# Patient Record
Sex: Female | Born: 1990
Health system: Southern US, Community
[De-identification: ages and names within clinical notes are randomized; demographics above are authoritative.]

## PROBLEM LIST (undated history)

## (undated) ENCOUNTER — Inpatient Hospital Stay (HOSPITAL_COMMUNITY): Payer: Self-pay

## (undated) DIAGNOSIS — D649 Anemia, unspecified: Secondary | ICD-10-CM

## (undated) DIAGNOSIS — M419 Scoliosis, unspecified: Secondary | ICD-10-CM

## (undated) DIAGNOSIS — F329 Major depressive disorder, single episode, unspecified: Secondary | ICD-10-CM

## (undated) DIAGNOSIS — J45909 Unspecified asthma, uncomplicated: Secondary | ICD-10-CM

## (undated) DIAGNOSIS — R51 Headache: Secondary | ICD-10-CM

## (undated) DIAGNOSIS — R519 Headache, unspecified: Secondary | ICD-10-CM

## (undated) DIAGNOSIS — F1910 Other psychoactive substance abuse, uncomplicated: Secondary | ICD-10-CM

## (undated) DIAGNOSIS — R569 Unspecified convulsions: Secondary | ICD-10-CM

## (undated) DIAGNOSIS — F419 Anxiety disorder, unspecified: Secondary | ICD-10-CM

## (undated) DIAGNOSIS — F32A Depression, unspecified: Secondary | ICD-10-CM

## (undated) DIAGNOSIS — K3184 Gastroparesis: Secondary | ICD-10-CM

## (undated) DIAGNOSIS — T7840XA Allergy, unspecified, initial encounter: Secondary | ICD-10-CM

## (undated) HISTORY — DX: Anemia, unspecified: D64.9

## (undated) HISTORY — PX: NO PAST SURGERIES: SHX2092

## (undated) HISTORY — DX: Anxiety disorder, unspecified: F41.9

## (undated) HISTORY — DX: Allergy, unspecified, initial encounter: T78.40XA

## (undated) HISTORY — PX: UPPER GI ENDOSCOPY: SHX6162

## (undated) HISTORY — DX: Depression, unspecified: F32.A

## (undated) HISTORY — DX: Major depressive disorder, single episode, unspecified: F32.9

## (undated) HISTORY — DX: Other psychoactive substance abuse, uncomplicated: F19.10

---

## 2012-03-18 ENCOUNTER — Encounter (HOSPITAL_BASED_OUTPATIENT_CLINIC_OR_DEPARTMENT_OTHER): Payer: Self-pay | Admitting: *Deleted

## 2012-03-18 ENCOUNTER — Emergency Department (HOSPITAL_BASED_OUTPATIENT_CLINIC_OR_DEPARTMENT_OTHER): Payer: TRICARE For Life (TFL)

## 2012-03-18 ENCOUNTER — Emergency Department (HOSPITAL_BASED_OUTPATIENT_CLINIC_OR_DEPARTMENT_OTHER)
Admission: EM | Admit: 2012-03-18 | Discharge: 2012-03-18 | Disposition: A | Discharge status: Home / Self Care | Payer: TRICARE For Life (TFL) | Attending: Emergency Medicine | Admitting: Emergency Medicine

## 2012-03-18 DIAGNOSIS — J3489 Other specified disorders of nose and nasal sinuses: Secondary | ICD-10-CM | POA: Insufficient documentation

## 2012-03-18 DIAGNOSIS — J069 Acute upper respiratory infection, unspecified: Secondary | ICD-10-CM | POA: Insufficient documentation

## 2012-03-18 DIAGNOSIS — F17200 Nicotine dependence, unspecified, uncomplicated: Secondary | ICD-10-CM | POA: Insufficient documentation

## 2012-03-18 DIAGNOSIS — R079 Chest pain, unspecified: Secondary | ICD-10-CM | POA: Insufficient documentation

## 2012-03-18 DIAGNOSIS — J45909 Unspecified asthma, uncomplicated: Secondary | ICD-10-CM | POA: Insufficient documentation

## 2012-03-18 HISTORY — DX: Unspecified asthma, uncomplicated: J45.909

## 2012-03-18 LAB — URINALYSIS, ROUTINE W REFLEX MICROSCOPIC
Glucose, UA: NEGATIVE mg/dL
Hgb urine dipstick: NEGATIVE
Ketones, ur: 15 mg/dL — AB
Protein, ur: NEGATIVE mg/dL
Urobilinogen, UA: 1 mg/dL (ref 0.0–1.0)

## 2012-03-18 MED ORDER — ONDANSETRON 4 MG PO TBDP
4.0000 mg | ORAL_TABLET | Freq: Once | ORAL | Status: AC
  Administered 2012-03-18: 4 mg via ORAL
  Filled 2012-03-18: qty 1

## 2012-03-18 MED ORDER — IBUPROFEN 800 MG PO TABS: 800.0000 mg | ORAL_TABLET | Freq: Three times a day (TID) | ORAL | Status: DC

## 2012-03-18 MED ORDER — ACETAMINOPHEN 325 MG PO TABS
650.0000 mg | ORAL_TABLET | Freq: Once | ORAL | Status: AC
  Administered 2012-03-18: 650 mg via ORAL
  Filled 2012-03-18: qty 2

## 2012-03-18 NOTE — ED Notes (Signed)
Sore throat, chills, aching all over, and cough. Symptoms x 2 days.

## 2012-03-18 NOTE — ED Provider Notes (Signed)
History     CSN: [id number]  Arrival date & time 03/18/12  1842   First MD Initiated Contact with Patient 03/18/12 2011      Chief Complaint  Patient presents with  . URI    (Consider location/radiation/quality/duration/timing/severity/associated sxs/prior treatment) Patient is a 21 y.o. female presenting with cough. The history is provided by the patient. No language interpreter was used.  Cough This is a new problem. The current episode started more than 2 days ago. The problem occurs constantly. The problem has been gradually worsening. The cough is non-productive. There has been no fever. Associated symptoms include chest pain, ear congestion and rhinorrhea. Pertinent negatives include no shortness of breath. She has tried nothing for the symptoms. She is not a smoker. Her past medical history does not include pneumonia.  Pt complains of cough and congestion.   Pt reports she is aching all over  Past Medical History  Diagnosis Date  . Asthma     History reviewed. No pertinent past surgical history.  No family history on file.  History  Substance Use Topics  . Smoking status: Current Every Day Smoker -- 0.5 packs/day    Types: Cigarettes  . Smokeless tobacco: Not on file  . Alcohol Use: Yes    OB History    Grav Para Term Preterm Abortions TAB SAB Ect Mult Living                  Review of Systems  HENT: Positive for rhinorrhea.   Respiratory: Positive for cough. Negative for shortness of breath.   Cardiovascular: Positive for chest pain.  All other systems reviewed and are negative.    Allergies  Review of patient's allergies indicates no known allergies.  Home Medications  No current outpatient prescriptions on file.  BP 119/81  Pulse 108  Temp 98 F (36.7 C) (Oral)  Resp 20  SpO2 100%  Physical Exam  Nursing note and vitals reviewed. Constitutional: She appears well-developed and well-nourished.  HENT:  Head: Normocephalic and atraumatic.    Right Ear: External ear normal.  Left Ear: External ear normal.  Nose: Nose normal.  Mouth/Throat: Oropharynx is clear and moist.  Eyes: Conjunctivae normal and EOM are normal. Pupils are equal, round, and reactive to light.  Neck: Normal range of motion. Neck supple.  Cardiovascular: Normal rate and normal heart sounds.   Pulmonary/Chest: Effort normal.  Abdominal: Soft.  Musculoskeletal: Normal range of motion.  Neurological: She is alert.  Skin: Skin is warm.  Psychiatric: She has a normal mood and affect.    ED Course  Procedures (including critical care time)  Labs Reviewed - No data to display No results found.   No diagnosis found.    MDM  No pneumonia        Lonia Sk[place], [place] 03/18/12 2257

## 2012-03-19 NOTE — ED Provider Notes (Signed)
Medical screening examination/treatment/procedure(s) were performed by non-physician practitioner and as supervising physician I was immediately available for consultation/collaboration.   Carleene Cooper III, MD 03/19/12 [phone]

## 2012-05-31 ENCOUNTER — Emergency Department (HOSPITAL_BASED_OUTPATIENT_CLINIC_OR_DEPARTMENT_OTHER): Payer: Self-pay

## 2012-05-31 ENCOUNTER — Emergency Department (HOSPITAL_BASED_OUTPATIENT_CLINIC_OR_DEPARTMENT_OTHER)
Admission: EM | Admit: 2012-05-31 | Discharge: 2012-05-31 | Disposition: A | Discharge status: Home / Self Care | Payer: Self-pay | Attending: Emergency Medicine | Admitting: Emergency Medicine

## 2012-05-31 ENCOUNTER — Encounter (HOSPITAL_BASED_OUTPATIENT_CLINIC_OR_DEPARTMENT_OTHER): Payer: Self-pay | Admitting: Family Medicine

## 2012-05-31 DIAGNOSIS — M62838 Other muscle spasm: Secondary | ICD-10-CM | POA: Insufficient documentation

## 2012-05-31 DIAGNOSIS — F17200 Nicotine dependence, unspecified, uncomplicated: Secondary | ICD-10-CM | POA: Insufficient documentation

## 2012-05-31 DIAGNOSIS — J45909 Unspecified asthma, uncomplicated: Secondary | ICD-10-CM | POA: Insufficient documentation

## 2012-05-31 DIAGNOSIS — M898X1 Other specified disorders of bone, shoulder: Secondary | ICD-10-CM

## 2012-05-31 DIAGNOSIS — Z8719 Personal history of other diseases of the digestive system: Secondary | ICD-10-CM | POA: Insufficient documentation

## 2012-05-31 HISTORY — DX: Gastroparesis: K31.84

## 2012-05-31 MED ORDER — CYCLOBENZAPRINE HCL 10 MG PO TABS: 10.0000 mg | ORAL_TABLET | Freq: Three times a day (TID) | ORAL | Status: DC | PRN

## 2012-05-31 MED ORDER — OXYCODONE-ACETAMINOPHEN 5-325 MG PO TABS
1.0000 | ORAL_TABLET | Freq: Once | ORAL | Status: AC
  Administered 2012-05-31: 1 via ORAL
  Filled 2012-05-31 (×2): qty 1

## 2012-05-31 MED ORDER — OXYCODONE-ACETAMINOPHEN 5-325 MG PO TABS: 2.0000 | ORAL_TABLET | ORAL | Status: DC | PRN

## 2012-05-31 NOTE — ED Notes (Signed)
Pt c/o left shoulder blade, collar pain x 8 days. Pt denies injury.

## 2012-05-31 NOTE — ED Provider Notes (Addendum)
History     CSN: [id number]  Arrival date & time 05/31/12  7829   First MD Initiated Contact with Patient 05/31/12 1011      Chief Complaint  Patient presents with  . Shoulder Pain    (Consider location/radiation/quality/duration/timing/severity/associated sxs/prior treatment) Patient is a 22 y.o. female presenting with shoulder pain. The history is provided by the patient.  Shoulder Pain This is a new (She is unaware of any injury but states that it occurred on new years after she drank a decent amount of alcohol and went to bed and in the morning when she woke up it was there.) problem. Episode onset: 8 days ago.  The problem occurs constantly. The problem has been gradually improving. Pertinent negatives include no shortness of breath. Associated symptoms comments: Pain and swelling over the distal clavicle. Pain in the left side of neck and back. The symptoms are aggravated by bending and twisting. The symptoms are relieved by relaxation. She has tried acetaminophen for the symptoms. The treatment provided no relief.    Past Medical History  Diagnosis Date  . Asthma   . Gastroparesis     History reviewed. No pertinent past surgical history.  No family history on file.  History  Substance Use Topics  . Smoking status: Current Every Day Smoker -- 0.5 packs/day    Types: Cigarettes  . Smokeless tobacco: Not on file  . Alcohol Use: Yes    OB History    Grav Para Term Preterm Abortions TAB SAB Ect Mult Living                  Review of Systems  Respiratory: Negative for cough and shortness of breath.   All other systems reviewed and are negative.    Allergies  Hydrocodone  Home Medications   Current Outpatient Rx  Name  Route  Sig  Dispense  Refill  . IBUPROFEN 800 MG PO TABS   Oral   Take 1 tablet (800 mg total) by mouth 3 (three) times daily.   21 tablet   0     BP 109/73  Pulse 73  Temp 98.1 F (36.7 C) (Oral)  Resp 20  Ht 5\' 4"  (1.626 m)  Wt  112 lb (50.803 kg)  BMI 19.22 kg/m2  SpO2 100%  LMP 05/04/2012  Physical Exam  Nursing note and vitals reviewed. Constitutional: She is oriented to person, place, and time. She appears well-developed and well-nourished. No distress.  HENT:  Head: Normocephalic and atraumatic.  Mouth/Throat: Oropharynx is clear and moist.  Eyes: Conjunctivae normal and EOM are normal. Pupils are equal, round, and reactive to light.  Neck: Normal range of motion. Neck supple.  Cardiovascular: Normal rate, regular rhythm and intact distal pulses.   No murmur heard. Pulmonary/Chest: Effort normal and breath sounds normal. No respiratory distress. She has no wheezes. She has no rales.  Musculoskeletal: Normal range of motion. She exhibits no edema and no tenderness.       Left shoulder: Normal.       Thoracic back: She exhibits tenderness, pain and spasm. She exhibits no bony tenderness and normal pulse.       Back:       Arms: Neurological: She is alert and oriented to person, place, and time.  Skin: Skin is warm and dry. No rash noted. No erythema.  Psychiatric: She has a normal mood and affect. Her behavior is normal.    ED Course  Procedures (including critical care time)  Labs  Reviewed - No data to display Dg Clavicle Left  05/31/2012  *RADIOLOGY REPORT*  Clinical Data: Left shoulder pain  LEFT CLAVICLE - 2+ VIEWS  Comparison: None.  Findings: Two views of the left clavicle submitted.  No acute fracture or subluxation.  No radiopaque foreign body.  IMPRESSION:  No acute fracture or subluxation.   Original Report Authenticated By: Natasha Mead, M.D.      1. Clavicle pain   2. Muscle spasm       MDM   Pain and swelling in the distal clavicle for the last 8 days with also pain and spasm in the left trapezius. Patient does have noticeable swelling and tenderness over her distal clavicle. She is neurovascularly intact and has full range of motion of her shoulder. She is unaware of any  injury. Plain films are within normal limits. Will discharge patient with anti-inflammatory and muscle relaxation. She will followup if symptoms worsen.     Gwyneth Sprout, MD 05/31/12 1149  Gwyneth Sprout, MD 05/31/12 1151  Gwyneth Sprout, MD 05/31/12 1204

## 2014-03-18 ENCOUNTER — Emergency Department (HOSPITAL_COMMUNITY): Payer: TRICARE For Life (TFL)

## 2014-03-18 ENCOUNTER — Emergency Department (HOSPITAL_COMMUNITY)
Admission: EM | Admit: 2014-03-18 | Discharge: 2014-03-18 | Disposition: A | Discharge status: Home / Self Care | Payer: TRICARE For Life (TFL) | Attending: Emergency Medicine | Admitting: Emergency Medicine

## 2014-03-18 ENCOUNTER — Encounter (HOSPITAL_COMMUNITY): Payer: Self-pay | Admitting: Emergency Medicine

## 2014-03-18 DIAGNOSIS — Y9389 Activity, other specified: Secondary | ICD-10-CM | POA: Insufficient documentation

## 2014-03-18 DIAGNOSIS — M545 Low back pain, unspecified: Secondary | ICD-10-CM

## 2014-03-18 DIAGNOSIS — Z720 Tobacco use: Secondary | ICD-10-CM | POA: Insufficient documentation

## 2014-03-18 DIAGNOSIS — Z8719 Personal history of other diseases of the digestive system: Secondary | ICD-10-CM | POA: Insufficient documentation

## 2014-03-18 DIAGNOSIS — J45909 Unspecified asthma, uncomplicated: Secondary | ICD-10-CM | POA: Insufficient documentation

## 2014-03-18 DIAGNOSIS — Y92410 Unspecified street and highway as the place of occurrence of the external cause: Secondary | ICD-10-CM | POA: Insufficient documentation

## 2014-03-18 DIAGNOSIS — M542 Cervicalgia: Secondary | ICD-10-CM

## 2014-03-18 DIAGNOSIS — S161XXA Strain of muscle, fascia and tendon at neck level, initial encounter: Secondary | ICD-10-CM | POA: Insufficient documentation

## 2014-03-18 DIAGNOSIS — S39012A Strain of muscle, fascia and tendon of lower back, initial encounter: Secondary | ICD-10-CM | POA: Insufficient documentation

## 2014-03-18 MED ORDER — DIAZEPAM 5 MG PO TABS
5.0000 mg | ORAL_TABLET | Freq: Once | ORAL | Status: AC
  Administered 2014-03-18: 5 mg via ORAL
  Filled 2014-03-18: qty 1

## 2014-03-18 MED ORDER — OXYCODONE-ACETAMINOPHEN 5-325 MG PO TABS: 1.0000 | ORAL_TABLET | ORAL | Status: DC | PRN

## 2014-03-18 MED ORDER — IBUPROFEN 800 MG PO TABS
800.0000 mg | ORAL_TABLET | Freq: Once | ORAL | Status: AC
  Administered 2014-03-18: 800 mg via ORAL
  Filled 2014-03-18: qty 1

## 2014-03-18 MED ORDER — METHOCARBAMOL 750 MG PO TABS: 750.0000 mg | ORAL_TABLET | Freq: Four times a day (QID) | ORAL | Status: DC | PRN

## 2014-03-18 MED ORDER — NAPROXEN 500 MG PO TABS: 500.0000 mg | ORAL_TABLET | Freq: Two times a day (BID) | ORAL | Status: DC | PRN
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
## 2014-03-18 NOTE — ED Provider Notes (Signed)
Medical screening examination/treatment/procedure(s) were performed by non-physician practitioner and as supervising physician I was immediately available for consultation/collaboration.   Yameli Delamater, MD 03/18/14 2225 

## 2014-03-18 NOTE — Discharge Instructions (Signed)
1. Medications: robaxin, naproxyn, vicodin, usual home medications 2. Treatment: rest, drink plenty of fluids, gentle stretching as discussed, alternate ice and heat 3. Follow Up: Please followup with your primary doctor in 3 days for discussion of your diagnoses and further evaluation after today's visit; if you do not have a primary care doctor use the resource guide provided to find one;  Return to the ER for worsening back pain, difficulty walking, loss of bowel or bladder control or other concerning symptoms     Lumbosacral Strain Lumbosacral strain is a strain of any of the parts that make up your lumbosacral vertebrae. Your lumbosacral vertebrae are the bones that make up the lower third of your backbone. Your lumbosacral vertebrae are held together by muscles and tough, fibrous tissue (ligaments).  CAUSES  A sudden blow to your back can cause lumbosacral strain. Also, anything that causes an excessive stretch of the muscles in the low back can cause this strain. This is typically seen when people exert themselves strenuously, fall, lift heavy objects, bend, or crouch repeatedly. RISK FACTORS  Physically demanding work.  Participation in pushing or pulling sports or sports that require a sudden twist of the back (tennis, golf, baseball).  Weight lifting.  Excessive lower back curvature.  Forward-tilted pelvis.  Weak back or abdominal muscles or both.  Tight hamstrings. SIGNS AND SYMPTOMS  Lumbosacral strain may cause pain in the area of your injury or pain that moves (radiates) down your leg.  DIAGNOSIS Your health care provider can often diagnose lumbosacral strain through a physical exam. In some cases, you may need tests such as X-ray exams.  TREATMENT  Treatment for your lower back injury depends on many factors that your clinician will have to evaluate. However, most treatment will include the use of anti-inflammatory medicines. HOME CARE INSTRUCTIONS   Avoid hard  physical activities (tennis, racquetball, waterskiing) if you are not in proper physical condition for it. This may aggravate or create problems.  If you have a back problem, avoid sports requiring sudden body movements. Swimming and walking are generally safer activities.  Maintain good posture.  Maintain a healthy weight.  For acute conditions, you may put ice on the injured area.  Put ice in a plastic bag.  Place a towel between your skin and the bag.  Leave the ice on for 20 minutes, 2-3 times a day.  When the low back starts healing, stretching and strengthening exercises may be recommended. SEEK MEDICAL CARE IF:  Your back pain is getting worse.  You experience severe back pain not relieved with medicines. SEEK IMMEDIATE MEDICAL CARE IF:   You have numbness, tingling, weakness, or problems with the use of your arms or legs.  There is a change in bowel or bladder control.  You have increasing pain in any area of the body, including your belly (abdomen).  You notice shortness of breath, dizziness, or feel faint.  You feel sick to your stomach (nauseous), are throwing up (vomiting), or become sweaty.  You notice discoloration of your toes or legs, or your feet get very cold. MAKE SURE YOU:   Understand these instructions.  Will watch your condition.  Will get help right away if you are not doing well or get worse. Document Released: 02/15/2005 Document Revised: 05/13/2013 Document Reviewed: 12/25/2012 [facility] Patient Information 2015 [place]. This information is not intended to replace advice given to you by your health care provider. Make sure you discuss any questions you have with your health  care provider.    Emergency Department Resource Guide 1) Find a Doctor and Pay Out of Pocket Although you won't have to find out who is covered by your insurance plan, it is a good idea to ask around and get recommendations. You will then need to call the office and  see if the doctor you have chosen will accept you as a new patient and what types of options they offer for patients who are self-pay. Some doctors offer discounts or will set up payment plans for their patients who do not have insurance, but you will need to ask so you aren't surprised when you get to your appointment.  2) Contact Your Local Health Department Not all health departments have doctors that can see patients for sick visits, but many do, so it is worth a call to see if yours does. If you don't know where your local health department is, you can check in your phone book. The CDC also has a tool to help you locate your state's health department, and many state websites also have listings of all of their local health departments.  3) Find a Walk-in Clinic If your illness is not likely to be very severe or complicated, you may want to try a walk in clinic. These are popping up all over the country in pharmacies, drugstores, and shopping centers. They're usually staffed by nurse practitioners or physician assistants that have been trained to treat common illnesses and complaints. They're usually fairly quick and inexpensive. However, if you have serious medical issues or chronic medical problems, these are probably not your best option.  No Primary Care Doctor: - Call Health Connect at  [phone] - they can help you locate a primary care doctor that  accepts your insurance, provides certain services, etc. - Physician Referral Service- [phone]  Chronic Pain Problems: Organization         Address  Phone   Notes  [facility]  [phone] Patients need to be referred by their primary care doctor.   Medication Assistance: Organization         Address  Phone   Notes  [facility] [address] [place], [place] [address] [phone] --Must be a resident of [facility] -- Must have NO insurance coverage whatsoever  (no Medicaid/ Medicare, etc.) -- The pt. MUST have a primary care doctor that directs their care regularly and follows them in the community   [facility]  [phone]   [employer]  [phone]    Agencies that provide inexpensive medical care: Organization         Address  Phone   Notes  [facility]  [phone]   [facility]    [phone]   [facility] [address] [place], [place] [address] [phone]   [facility] [address] [place] [place] [phone]   [facility]anned Parenthood    [phone]   [facility]    [phone]   [facility]  [address], [place] Phone:  [phone], Fax:  [phone] Hours of Operation:  9 am - 6 pm, M-F.  Also accepts Medicaid/Medicare and self-pay.  [facility]  [address], [place] Phone: [phone], Fax: [phone]. Hours of Operation:  8:30 am - 5:30 pm, M-F.  Also accepts Medicaid and  self-pay.  [facility] [place] [address], [place] Phone: [phone]   [facility] [address] Natasha Bence [place], [place] [phone], Ext. [phone] Mondays & Thursdays: 7-9 AM.  First 15 patients are seen on a first come, first serve basis.    Medicaid-accepting [facility] Providers:  Organization         Address  Phone   Notes  [facility] [address], [place] [phone] Also accepts self-pay patients.  [facility] [address] [place], [place]  [phone]   [facility] [address], [place] [phone]   [facility] [address], [place] [phone]   Renaye Rakers [address], [place]   [phone] Only accepts [place] Access [place] patients after they have their name applied to their  card.   Self-Pay (no insurance) in [facility]:  Organization         Address  Phone   Notes  Sickle Cell Patients, [facility] [address] [place], [place] [phone]   [facility] [address] [place], [place] [phone]   [facility]  [address], [place] [phone]   [facility]/Dr. Osei-Bonsu  [address] [place] or [address], [place] [phone] Phone number for both [place] and [place] locations is the same.  [facility] [address], [place] [phone]   [facility] [address], [place] or [address] [phone]   [facility] [address] [place] [phone], phone; [phone], fax Sees patients 1st and 3rd Saturday of every month.  Must not qualify for public or private insurance (i.e. Medicaid, Medicare, [place] Health Choice, Veterans' Benefits)  Household income should be no more than 200% of the poverty level The clinic cannot treat you if you are pregnant or think you are pregnant  Sexually transmitted diseases are not treated at the clinic.   Dental Care: Organization         Address  Phone  Notes  [facility] [address] [place], [place] [phone] Accepts children up to age 69 who are enrolled in [place] or [place] Health Choice; pregnant women with a Medicaid card; and children who have applied for Medicaid or [place] Health Choice, but were declined, whose parents can pay a reduced fee at time of service.  [facility]  [address], [place] [phone] Accepts children up to age 94 who are enrolled in [place] or [place] Health Choice; pregnant women with a Medicaid card; and children who have applied for Medicaid or [place] Health  Choice, but were declined, whose parents can pay a reduced fee at time of service.  [facility]  [address] [place], [place] [phone] Patients are seen by appointment only. Walk-ins are not accepted. [facility] will see patients 51 years of age and older. Monday - Tuesday (8am-5pm) Most Wednesdays (8:30-5pm) $30 per visit, cash only  [facility]  [address], [facility] [phone] Patients are seen by appointment only. Walk-ins are not accepted. [facility] will see patients 18 years  of age and older. One Wednesday Evening (Monthly: Volunteer Based).  $30 per visit, cash only  [facility]  [phone] for adults; Children under age 204, call Graduate Pediatric Dentistry at [phone]. Children aged 684-14, please call [phone] to request a pediatric application.  Dental services are provided in all areas of dental care including fillings, crowns and bridges, complete and partial dentures, implants, gum treatment, root canals, and extractions. Preventive care is also provided. Treatment is provided to both adults and children. Patients are selected via a lottery and there is often a waiting list.   [facility] [address], [place]  [phone] [email or web address]   [facility] [address], [place], [place] [phone], Ext. [phone] Second and Fourth Thursday of each month, opens at 6:30 AM; Clinic ends at 9 AM.  Patients are seen on a first-come first-served basis, and a limited number are seen during each clinic.   [facility]  [address], [place], [place] [phone]) [phone]   Eligibility Requirements You must have lived in [place], [place], or [place] counties for at least the last three months.   You cannot be eligible for state or federal sponsored [place] Cityhealthcare insurance, including CIGNAVeterans Administration, [place]icaid, or [employer] EntertainmentMedicare.   You  generally cannot be eligible for healthcare insurance through your employer.    How to apply: Eligibility screenings are held every Tuesday and Wednesday afternoon from 1:00 pm until 4:00 pm. You do not need an appointment for the interview!  [facility] [address], [place], [place] [phone]   [facility]  [phone]   [facility]  [phone]   [facility]  [phone]    Behavioral Health Resources in the Community: Intensive Outpatient Programs Organization         Address  Phone  Notes  [facility] [address], [place], [place] [phone]   [facility] [address], [place], [place] [phone]   [facility] [address], [place], [place]  [phone]   [facility] [address],  [place], [place] [phone] or [phone]   Substance Abuse Resources Organization         Address  Phone  Notes  [facility]  [phone]   [facility]  [phone]   [facility]  [phone]   [facility]  [phone]   Residential & Outpatient Substance Abuse Program  [phone]   Psychological Services Organization         Address  Phone  Notes  [facility]  [phone]   [facility]  [phone]- [phone]   [facility] [address], [place] [phone] or [phone]    Mobile Crisis Teams Organization         Address  Phone  Notes  [facility], Mobile Crisis Care Unit  [phone]   [facility]  [address] [place], [place] [phone]   Doristine LocksSharon DeEsch [address] [place] [phone]    Self-Help/Support Groups Organization         Address  Phone             Notes  Mental Health Assoc. of  - variety of support groups  336- [phone] Call for  more information  Narcotics Anonymous (NA), [facility] [address], [place]  2 meetings at this location   Residential Treatment Programs Organization         Address  Phone  Notes  [facility] [address],    [place]  [phone]   [facility] [place]  [address], [place] [address], [place], [place] [phone]   [facility] [address] [place], [place] [phone] Admissions: 8am-3pm M-F  [facility] [address]    [place], [place] [phone]   [facility] [address] [place], [place], [place] [phone]   [facility] [address]  [place], [place] [phone]   [facility] - Intensive Outpatient [address], [place], [place] [phone]   [facility] ([facility] [address]  [place], [place] [phone] or [phone]   [facility] ([facility]) [address], [place], [place] [phone] Accepts Medicaid  [facility] [address]  [place] [phone] Substance Abuse/Addiction Treatment   [facility] Organization         Address  Phone  Notes  [facility]  [phone]   Angie Fava, PhD [address] Ervin Knack [place], [place]   [phone] or [phone]   [facility]   [address] [place], [place] [phone]   [facility] [address], [place], [place] [phone] Insurance/Medicaid/sponsorship through [facility] and Families [address]                                    [place], [place] [phone] Therapy/tele-psych/case  [facility] [address], [place] [phone]    Dr. Lolly Mustache  [phone]   [facility]  [facility] Dept. 1) [address], [place] 2) [address] [place] 3)  [address], [place] [phone]  [phone]   [facility] Child Abuse Hotline [phone] or [phone] (After Hours)

## 2014-07-21 ENCOUNTER — Encounter (HOSPITAL_COMMUNITY): Payer: Self-pay | Admitting: Emergency Medicine

## 2014-07-21 ENCOUNTER — Emergency Department (HOSPITAL_COMMUNITY)
Admission: EM | Admit: 2014-07-21 | Discharge: 2014-07-21 | Disposition: A | Discharge status: Home / Self Care | Payer: No Typology Code available for payment source | Attending: Emergency Medicine | Admitting: Emergency Medicine

## 2014-07-21 DIAGNOSIS — Z720 Tobacco use: Secondary | ICD-10-CM | POA: Diagnosis not present

## 2014-07-21 DIAGNOSIS — Z3202 Encounter for pregnancy test, result negative: Secondary | ICD-10-CM | POA: Diagnosis not present

## 2014-07-21 DIAGNOSIS — J45909 Unspecified asthma, uncomplicated: Secondary | ICD-10-CM | POA: Diagnosis not present

## 2014-07-21 DIAGNOSIS — R197 Diarrhea, unspecified: Secondary | ICD-10-CM | POA: Diagnosis not present

## 2014-07-21 DIAGNOSIS — R1013 Epigastric pain: Secondary | ICD-10-CM | POA: Insufficient documentation

## 2014-07-21 DIAGNOSIS — Z79899 Other long term (current) drug therapy: Secondary | ICD-10-CM | POA: Insufficient documentation

## 2014-07-21 DIAGNOSIS — Z8719 Personal history of other diseases of the digestive system: Secondary | ICD-10-CM | POA: Insufficient documentation

## 2014-07-21 DIAGNOSIS — R1010 Upper abdominal pain, unspecified: Secondary | ICD-10-CM | POA: Diagnosis not present

## 2014-07-21 LAB — URINALYSIS, ROUTINE W REFLEX MICROSCOPIC
BILIRUBIN URINE: NEGATIVE
Glucose, UA: NEGATIVE mg/dL
Hgb urine dipstick: NEGATIVE
KETONES UR: NEGATIVE mg/dL
Leukocytes, UA: NEGATIVE
NITRITE: NEGATIVE
Protein, ur: NEGATIVE mg/dL
Specific Gravity, Urine: 1.007 (ref 1.005–1.030)
UROBILINOGEN UA: 1 mg/dL (ref 0.0–1.0)
pH: 6 (ref 5.0–8.0)

## 2014-07-21 LAB — CBC WITH DIFFERENTIAL/PLATELET
Basophils Absolute: 0 10*3/uL (ref 0.0–0.1)
Basophils Relative: 0 % (ref 0–1)
EOS ABS: 0.1 10*3/uL (ref 0.0–0.7)
EOS PCT: 1 % (ref 0–5)
HCT: 40.6 % (ref 36.0–46.0)
HEMOGLOBIN: 13.3 g/dL (ref 12.0–15.0)
LYMPHS ABS: 1.9 10*3/uL (ref 0.7–4.0)
Lymphocytes Relative: 35 % (ref 12–46)
MCH: 28.2 pg (ref 26.0–34.0)
MCHC: 32.8 g/dL (ref 30.0–36.0)
MCV: 86 fL (ref 78.0–100.0)
MONO ABS: 0.5 10*3/uL (ref 0.1–1.0)
MONOS PCT: 10 % (ref 3–12)
Neutro Abs: 2.9 10*3/uL (ref 1.7–7.7)
Neutrophils Relative %: 54 % (ref 43–77)
Platelets: 271 10*3/uL (ref 150–400)
RBC: 4.72 MIL/uL (ref 3.87–5.11)
RDW: 13.8 % (ref 11.5–15.5)
WBC: 5.4 10*3/uL (ref 4.0–10.5)

## 2014-07-21 LAB — COMPREHENSIVE METABOLIC PANEL
ALK PHOS: 63 U/L (ref 39–117)
ALT: 13 U/L (ref 0–35)
AST: 18 U/L (ref 0–37)
Albumin: 4.3 g/dL (ref 3.5–5.2)
Anion gap: 6 (ref 5–15)
BUN: 11 mg/dL (ref 6–23)
CALCIUM: 9.1 mg/dL (ref 8.4–10.5)
CO2: 26 mmol/L (ref 19–32)
CREATININE: 0.61 mg/dL (ref 0.50–1.10)
Chloride: 106 mmol/L (ref 96–112)
GFR calc Af Amer: >90 mL/min (ref >90)
GFR calc non Af Amer: >90 mL/min (ref >90)
Glucose, Bld: 87 mg/dL (ref 70–99)
Potassium: 3.6 mmol/L (ref 3.5–5.1)
SODIUM: 138 mmol/L (ref 135–145)
TOTAL PROTEIN: 7.8 g/dL (ref 6.0–8.3)
Total Bilirubin: 1.1 mg/dL (ref 0.3–1.2)

## 2014-07-21 LAB — PREGNANCY, URINE: PREG TEST UR: NEGATIVE

## 2014-07-21 LAB — LIPASE, BLOOD: LIPASE: 18 U/L (ref 11–59)

## 2014-07-21 MED ORDER — HYOSCYAMINE SULFATE 0.125 MG PO TABS
0.1250 mg | ORAL_TABLET | Freq: Once | ORAL | Status: DC
Start: 2014-07-21 — End: 2014-07-21
  Filled 2014-07-21: qty 1

## 2014-07-21 MED ORDER — HYOSCYAMINE SULFATE 0.125 MG SL SUBL: 0.1250 mg | SUBLINGUAL_TABLET | SUBLINGUAL | Status: DC | PRN

## 2014-07-21 MED ORDER — HYOSCYAMINE SULFATE 0.125 MG SL SUBL
0.1250 mg | SUBLINGUAL_TABLET | Freq: Once | SUBLINGUAL | Status: AC
  Administered 2014-07-21: 0.125 mg via SUBLINGUAL

## 2014-07-21 NOTE — Discharge Instructions (Signed)

## 2014-07-21 NOTE — ED Notes (Signed)
Pt states that over the past couple weeks she has started eating more than once a day and ever since she has had diarrhea. Pt states that since 8-9am she has had about 6 episodes of diarrhea.

## 2014-07-21 NOTE — Progress Notes (Signed)
EDCM spoke to patient at bedside.  Patient listed as having [employer] insurance without a pcp.  Patient reports she needs to find a pcp and would go to the [address] [address]  [facility] also provided patient with list of pcps who accept [employer] insurance within a ten mile radius of patient's zip code [phone].  Dicussed importance and purpose of having a pcp.  Patient verbalized understanding and thankful for services.  No further EDCM needs at this time.

## 2014-07-21 NOTE — ED Provider Notes (Signed)
CSN: [id number]     Arrival date & time 07/21/14  1316 History   First MD Initiated Contact with Patient 07/21/14 1508     Chief Complaint  Patient presents with  . Diarrhea    Patient is a 24 y.o. female presenting with diarrhea. The history is provided by the patient. No language interpreter was used.  Diarrhea  Samantha Casey presents for evaluation of diarrhea. She reports 2 weeks of diarrhea after increasing her fluid intake. She has proximally 4 episodes a day of watery stools. There is no blood present. Today she's had 7 episodes of watery stools. She reports diffuse upper abdominal pain described as a pressure type sensation. Abdominal pain is waxing and waning with no clear triggers. She denies any fevers, nausea, vomiting, dysuria, discharge. Symptoms are moderate, waxing and waning, worsening. No recent antibiotic use. No sick contacts but she is around multiple children. She has not had any spring or stream water.  Past Medical History  Diagnosis Date  . Asthma   . Gastroparesis    History reviewed. No pertinent past surgical history. No family history on file. History  Substance Use Topics  . Smoking status: Current Every Day Smoker -- 0.50 packs/day    Types: Cigarettes  . Smokeless tobacco: Not on file  . Alcohol Use: Yes   OB History    No data available     Review of Systems  Gastrointestinal: Positive for diarrhea.  All other systems reviewed and are negative.     Allergies  Hydrocodone  Home Medications   Prior to Admission medications   Medication Sig Start Date End Date Taking? Authorizing Provider  methocarbamol (ROBAXIN) 750 MG tablet Take 1 tablet (750 mg total) by mouth 4 (four) times daily as needed for muscle spasms (Take 1 tablet every 6 hours as needed for muscle spasms.). 03/18/14   Hannah Muthersbaugh, PA-C  naproxen (NAPROSYN) 500 MG tablet Take 1 tablet (500 mg total) by mouth 2 (two) times daily as needed. 03/18/14   Hannah Muthersbaugh, PA-C   oxyCODONE-acetaminophen (PERCOCET) 5-325 MG per tablet Take 1-2 tablets by mouth every 4 (four) hours as needed (Take 1- 2 tablets every 4 - 6 hours as needed for pain.). 03/18/14   Hannah Muthersbaugh, PA-C   BP 122/72 mmHg  Pulse 69  Temp(Src) 98.3 F (36.8 C) (Oral)  Resp 18  SpO2 100%  LMP 06/24/2014 Physical Exam  Constitutional: She is oriented to person, place, and time. She appears well-developed and well-nourished.  HENT:  Head: Normocephalic and atraumatic.  Cardiovascular: Normal rate and regular rhythm.   No murmur heard. Pulmonary/Chest: Effort normal and breath sounds normal. No respiratory distress.  Abdominal: Soft. There is no rebound and no guarding.  Mild epigastric tenderness. Negative Murphy's.  Musculoskeletal: She exhibits no edema or tenderness.  Neurological: She is alert and oriented to person, place, and time.  Skin: Skin is warm and dry.  Psychiatric: She has a normal mood and affect. Her behavior is normal.  Nursing note and vitals reviewed.   ED Course  Procedures (including critical care time) Labs Review Labs Reviewed  COMPREHENSIVE METABOLIC PANEL  CBC WITH DIFFERENTIAL/PLATELET  URINALYSIS, ROUTINE W REFLEX MICROSCOPIC  LIPASE, BLOOD  PREGNANCY, URINE    Imaging Review No results found.   EKG Interpretation None      MDM   Final diagnoses:  Diarrhea in adult patient    Patient here for evaluation of epigastric abdominal pain and diarrhea. Patient in no acute distress in  the emergency department and well-hydrated. Laboratory analysis not consistent with acute hepatitis, significant electrolyte abnormalities. History of presentation is not consistent with cholecystitis. Discussed with patient home. PCP follow-up for diarrhea as well as outpatient gastroenterology follow-up.     Samantha Casey ReeTilden Fossas, MD 07/21/14 [phone]

## 2014-08-14 ENCOUNTER — Ambulatory Visit (INDEPENDENT_AMBULATORY_CARE_PROVIDER_SITE_OTHER): Payer: No Typology Code available for payment source | Admitting: Family Medicine

## 2014-08-14 VITALS — BP 106/64 | HR 76 | Temp 98.1°F | Resp 16 | Ht 64.0 in | Wt 128.0 lb

## 2014-08-14 DIAGNOSIS — J029 Acute pharyngitis, unspecified: Secondary | ICD-10-CM

## 2014-08-14 DIAGNOSIS — H9203 Otalgia, bilateral: Secondary | ICD-10-CM | POA: Diagnosis not present

## 2014-08-14 DIAGNOSIS — H6121 Impacted cerumen, right ear: Secondary | ICD-10-CM

## 2014-08-14 LAB — POCT RAPID STREP A (OFFICE): Rapid Strep A Screen: NEGATIVE

## 2014-08-14 MED ORDER — AMOXICILLIN 875 MG PO TABS: 875.0000 mg | ORAL_TABLET | Freq: Two times a day (BID) | ORAL | Status: DC

## 2014-08-14 NOTE — Progress Notes (Addendum)
° °  Subjective:    Patient ID: Samantha Casey, female    DOB: Sep 05, 1990, 24 y.o.   MRN: [id number] This chart was scribed for Elvina SidleKurt Lauenstein, MD by Jolene Provostobert Halas, Medical Scribe. This patient was seen in Room 8 and the patient's care was started a 3:30 PM.  Chief Complaint  Patient presents with   Sore Throat    x 2 days   Ear Pain    both   Cough    HPI HPI Comments: Samantha Casey is a 24 y.o. female who [facility] to [facility] complaining of a scratchy throat and bilateral burning in her ears. Pt also states that her bowel movements have increased. Pt endorses associated migraines, fatigue and light headedness.  Pt states she used to get strep throat annually, for many years.    Review of Systems  Constitutional: Positive for fatigue. Negative for fever and chills.  HENT: Positive for congestion, ear pain and sore throat.   Gastrointestinal: Negative for abdominal pain.       Increased bowel movements       Objective:   Physical Exam  Constitutional: She is oriented to person, place, and time. She appears well-developed and well-nourished. No distress.  HENT:  Head: Normocephalic and atraumatic.  Eyes: Pupils are equal, round, and reactive to light.  Neck: Neck supple.  Cardiovascular: Normal rate.   Pulmonary/Chest: Effort normal. No respiratory distress.  Musculoskeletal: Normal range of motion.  Neurological: She is alert and oriented to person, place, and time. Coordination normal.  Skin: Skin is warm and dry. She is not diaphoretic.  Psychiatric: She has a normal mood and affect. Her behavior is normal.  Nursing note and vitals reviewed.  Results for orders placed or performed in visit on 08/14/14  POCT rapid strep A  Result Value Ref Range   Rapid Strep A Screen Negative Negative       Assessment & Plan:   This chart was scribed in my presence and reviewed by me personally.    ICD-9-CM ICD-10-CM   1. Sore throat 462 J02.9 POCT rapid strep A   amoxicillin (AMOXIL) 875 MG tablet  2. Otalgia of both ears 388.70 H92.03 amoxicillin (AMOXIL) 875 MG tablet  3. Cerumen impaction, right 380.4 H61.21 amoxicillin (AMOXIL) 875 MG tablet     Signed, Elvina SidleKurt Lauenstein, MD

## 2014-08-14 NOTE — Patient Instructions (Signed)

## 2014-09-02 ENCOUNTER — Telehealth: Payer: Self-pay

## 2014-09-02 ENCOUNTER — Encounter: Payer: No Typology Code available for payment source | Admitting: Physician Assistant

## 2014-09-17 ENCOUNTER — Ambulatory Visit (INDEPENDENT_AMBULATORY_CARE_PROVIDER_SITE_OTHER): Payer: No Typology Code available for payment source | Admitting: Physician Assistant

## 2014-09-17 ENCOUNTER — Other Ambulatory Visit: Payer: Self-pay | Admitting: Physician Assistant

## 2014-09-17 ENCOUNTER — Encounter: Payer: Self-pay | Admitting: Physician Assistant

## 2014-09-17 VITALS — BP 108/69 | HR 58 | Temp 98.2°F | Resp 16 | Ht 64.0 in | Wt 128.2 lb

## 2014-09-17 DIAGNOSIS — F331 Major depressive disorder, recurrent, moderate: Secondary | ICD-10-CM | POA: Diagnosis not present

## 2014-09-17 DIAGNOSIS — Z0000 Encounter for general adult medical examination without abnormal findings: Secondary | ICD-10-CM | POA: Diagnosis not present

## 2014-09-17 DIAGNOSIS — N63 Unspecified lump in unspecified breast: Secondary | ICD-10-CM

## 2014-09-17 DIAGNOSIS — Z113 Encounter for screening for infections with a predominantly sexual mode of transmission: Secondary | ICD-10-CM | POA: Diagnosis not present

## 2014-09-17 DIAGNOSIS — Z8659 Personal history of other mental and behavioral disorders: Secondary | ICD-10-CM | POA: Diagnosis not present

## 2014-09-17 LAB — POCT WET PREP WITH KOH
CLUE CELLS WET PREP PER HPF POC: NEGATIVE
KOH PREP POC: NEGATIVE
RBC Wet Prep HPF POC: NEGATIVE
Trichomonas, UA: NEGATIVE
WBC WET PREP PER HPF POC: NEGATIVE
YEAST WET PREP PER HPF POC: NEGATIVE

## 2014-09-17 MED ORDER — FLUOXETINE HCL 20 MG PO TABS
20.0000 mg | ORAL_TABLET | Freq: Every day | ORAL | Status: DC
Start: 2014-09-17 — End: 2016-04-20

## 2014-09-17 NOTE — Progress Notes (Signed)
09/17/2014 at 8:59 PM  Samantha Casey / DOB: 08-May-1991 / MRN: [id number]  The patient  does not have a problem list on file.  SUBJECTIVE  Chief complaint: Annual Exam and Vaginal Discharge   Patient with a history of dysthymic mood, low energy and anhedonia.  Hospitalized 1.5 years ago for suicidal attempt and self injurious behaviors.  She was diagnosed with borderline personality disorder, lost her job shortly thereafter and has been without insurance, so has been un-medicated since.  She denies the desire to hurt herself and others today.  Sleeps poorly.  Reports poor concentration.   Her last pap was two years ago and was normal.    She  has a past medical history of Asthma; Gastroparesis; Allergy; Anemia; Anxiety; Depression; and Substance abuse.    Medications reviewed and updated by myself where necessary, and exist elsewhere in the encounter.   Samantha Casey is allergic to hydrocodone. She  reports that she has been smoking Cigarettes.  She has been smoking about 0.50 packs per day. She does not have any smokeless tobacco history on file. She reports that she drinks alcohol. She reports that she does not use illicit drugs. She  has no sexual activity history on file. The patient  has no past surgical history on file.  Her family history includes Cancer in her paternal grandfather; Hyperlipidemia in her father, mother, and paternal grandfather; Mental illness in her sister; Stroke in her father.  Review of Systems  Constitutional: Negative for fever and chills.  Eyes: Negative.   Respiratory: Negative.   Cardiovascular: Negative.   Skin: Negative for itching and rash.  Neurological: Negative for headaches.  Psychiatric/Behavioral: Positive for depression and substance abuse. Negative for suicidal ideas, hallucinations and memory loss. The patient is nervous/anxious. The patient does not have insomnia.     OBJECTIVE  Her  height is 5\' 4"  (1.626 m) and weight is 128 lb 3.2 oz  (58.151 kg). Her oral temperature is 98.2 F (36.8 C). Her blood pressure is 108/69 and her pulse is 58. Her respiration is 16 and oxygen saturation is 98%.  The patient's body mass index is 21.99 kg/(m^2).  Physical Exam  Constitutional: She is oriented to person, place, and time. She appears well-developed and well-nourished. No distress.  HENT:  Right Ear: Hearing, tympanic membrane, external ear and ear canal normal.  Left Ear: Hearing, tympanic membrane, external ear and ear canal normal.  Nose: Mucosal edema present. Right sinus exhibits no maxillary sinus tenderness and no frontal sinus tenderness. Left sinus exhibits no maxillary sinus tenderness and no frontal sinus tenderness.  Mouth/Throat: Uvula is midline, oropharynx is clear and moist and mucous membranes are normal.  Cardiovascular: Normal rate, regular rhythm and normal heart sounds.   Respiratory: Effort normal and breath sounds normal. She has no wheezes. She has no rales.  Genitourinary: Vagina normal and uterus normal. No vaginal discharge found.  Musculoskeletal: Normal range of motion.  Neurological: She is alert and oriented to person, place, and time.  Skin: Skin is warm and dry. She is not diaphoretic.  Psychiatric: She has a normal mood and affect.    Results for orders placed or performed in visit on 09/17/14 (from the past 24 hour(s))  POCT Wet Prep with KOH     Status: None   Collection Time: 09/17/14  5:50 PM  Result Value Ref Range   Trichomonas, UA Negative    Clue Cells Wet Prep HPF POC neg    Epithelial Wet Prep  HPF POC 6-15    Yeast Wet Prep HPF POC neg    Bacteria Wet Prep HPF POC 1+    RBC Wet Prep HPF POC neg    WBC Wet Prep HPF POC neg    KOH Prep POC Negative     ASSESSMENT & PLAN  Samantha Casey was seen today for annual exam and vaginal discharge.  Diagnoses and all orders for this visit:  Major depressive disorder, recurrent episode, moderate Orders: -     FLUoxetine (PROZAC) 20 MG tablet;  Take 1 tablet (20 mg total) by mouth daily. -     Ambulatory referral to Psychology  History of borderline personality disorder Orders: -     Ambulatory referral to Psychology  Routine screening for STI (sexually transmitted infection) Orders: -     POCT Wet Prep with KOH -     HIV antibody (with reflex) -     RPR -     GC/Chlamydia Probe Amp -     Hepatitis C Ab Reflex HCV RNA, QUANT  Breast mass in female Orders: -     MM Digital Diagnostic Bilat; Future    The patient was advised to call or come back to clinic if she does not see an improvement in symptoms, or worsens with the above plan.   Samantha Casey, MHS, PA-C [facility] 09/17/2014 8:59 PM

## 2014-09-17 NOTE — Progress Notes (Signed)
   Subjective:    Patient ID: Samantha Casey, female    DOB: February 16, 1991, 24 y.o.   MRN: [id number]  HPI    Review of Systems  Constitutional: Positive for diaphoresis, activity change, appetite change and fatigue.  HENT: Positive for congestion, dental problem, ear pain, mouth sores, postnasal drip, rhinorrhea, sinus pressure, sneezing and sore throat.   Eyes: Positive for photophobia, itching and visual disturbance.  Respiratory: Positive for cough, chest tightness and shortness of breath.   Cardiovascular: Positive for chest pain.  Gastrointestinal: Positive for nausea and abdominal pain.  Endocrine: Positive for polydipsia.  Genitourinary: Positive for vaginal discharge.  Musculoskeletal: Positive for back pain, neck pain and neck stiffness.  Neurological: Positive for dizziness, tremors, weakness, light-headedness, numbness and headaches.  Psychiatric/Behavioral: Positive for suicidal ideas, behavioral problems, confusion, sleep disturbance, dysphoric mood and agitation. The patient is nervous/anxious.        Objective:   Physical Exam        Assessment & Plan:

## 2014-09-18 ENCOUNTER — Telehealth: Payer: Self-pay

## 2014-09-18 ENCOUNTER — Other Ambulatory Visit: Payer: Self-pay | Admitting: Physician Assistant

## 2014-09-18 LAB — HIV ANTIBODY (ROUTINE TESTING W REFLEX): HIV 1&2 Ab, 4th Generation: NONREACTIVE

## 2014-09-18 LAB — HEPATITIS C ANTIBODY: HCV Ab: NEGATIVE

## 2014-09-18 LAB — RPR

## 2014-09-18 NOTE — Telephone Encounter (Signed)
Order changed. Thank you for your help with the code. Deliah BostonMichael Clark, MS, PA-C   5:30 PM, 09/18/2014

## 2014-09-18 NOTE — Telephone Encounter (Signed)
Jenel LucksRoberta from [facility] states their office does not do mmg on patients under 30. She is requesting the order to be changed from a mmg to an ultra sound. She stated the order showed a mass on the left breast. If we are only doing it on the left breast use code [phone]. If bilateral we will need to use 2 codes [phone] (left breast) and [id number] (rt breast). Once changed roberta stated she would contact patient to scheduled.

## 2014-09-18 NOTE — Addendum Note (Signed)
Addended by: Ofilia NeasLARK, Eugen Jeansonne L on: 09/18/2014 05:29 PM   Modules accepted: Orders, SmartSet

## 2014-09-18 NOTE — Telephone Encounter (Signed)
FLUoxetine (PROZAC) 20 MG tablet To expensive   Requesting less costly medication send to [facility] [address]  [phone]

## 2014-09-18 NOTE — Telephone Encounter (Signed)
[facility] with [facility] the patient ask us to send her medications.  He is going to change the medication to capsules and it will be roughly 5 dollars. Deliah BostonMichael Hildreth Orsak, MS, PA-C   5:32 PM, 09/18/2014

## 2014-09-18 NOTE — Telephone Encounter (Signed)
michaelL What else can you rx for pt that would be cheaper?

## 2014-09-18 NOTE — Telephone Encounter (Signed)
Samantha Casey did you want bilateral?

## 2014-09-21 LAB — GC/CHLAMYDIA PROBE AMP
CT Probe RNA: NEGATIVE
GC Probe RNA: NEGATIVE

## 2014-10-06 ENCOUNTER — Other Ambulatory Visit: Payer: No Typology Code available for payment source

## 2014-10-30 ENCOUNTER — Emergency Department (HOSPITAL_COMMUNITY)
Admission: EM | Admit: 2014-10-30 | Discharge: 2014-10-30 | Disposition: A | Discharge status: Home / Self Care | Payer: No Typology Code available for payment source | Attending: Emergency Medicine | Admitting: Emergency Medicine

## 2014-10-30 ENCOUNTER — Encounter (HOSPITAL_COMMUNITY): Payer: Self-pay | Admitting: Emergency Medicine

## 2014-10-30 DIAGNOSIS — J45909 Unspecified asthma, uncomplicated: Secondary | ICD-10-CM | POA: Diagnosis not present

## 2014-10-30 DIAGNOSIS — Z8719 Personal history of other diseases of the digestive system: Secondary | ICD-10-CM | POA: Insufficient documentation

## 2014-10-30 DIAGNOSIS — Z720 Tobacco use: Secondary | ICD-10-CM | POA: Diagnosis not present

## 2014-10-30 DIAGNOSIS — Z862 Personal history of diseases of the blood and blood-forming organs and certain disorders involving the immune mechanism: Secondary | ICD-10-CM | POA: Diagnosis not present

## 2014-10-30 DIAGNOSIS — M419 Scoliosis, unspecified: Secondary | ICD-10-CM | POA: Insufficient documentation

## 2014-10-30 DIAGNOSIS — F329 Major depressive disorder, single episode, unspecified: Secondary | ICD-10-CM | POA: Insufficient documentation

## 2014-10-30 DIAGNOSIS — Z79899 Other long term (current) drug therapy: Secondary | ICD-10-CM | POA: Insufficient documentation

## 2014-10-30 DIAGNOSIS — F419 Anxiety disorder, unspecified: Secondary | ICD-10-CM | POA: Diagnosis not present

## 2014-10-30 DIAGNOSIS — M545 Low back pain, unspecified: Secondary | ICD-10-CM

## 2014-10-30 HISTORY — DX: Scoliosis, unspecified: M41.9

## 2014-10-30 MED ORDER — IBUPROFEN 600 MG PO TABS: 600.0000 mg | ORAL_TABLET | Freq: Four times a day (QID) | ORAL | Status: DC | PRN

## 2014-10-30 NOTE — ED Notes (Signed)
Per patient, states history of scoliosis-has not been treated-increased pain for a couple of days

## 2014-10-30 NOTE — ED Provider Notes (Signed)
CSN: [id number]     Arrival date & time 10/30/14  1334 History  This chart was scribed for Eyvonne Mechanic, PA-C, working with Purvis Sheffield, MD by Octavia Heir, ED Scribe. This patient was seen in room WTR8/WTR8 and the patient's care was started at 2:13 PM.     Chief Complaint  Patient presents with  . Back Pain     The history is provided by the patient. No language interpreter was used.    24 year old female with a reported history of minor scoliosis presents with a intermittent lower left back pain onset 5 days ago. Pt was diagnosed with mild scoliosis 6 years ago and was not taking any medication or following up for treatment. She reports intermittent pain for the last 6 years, reports this is similar to previous flares. Pt notes it is painful when she ambulates, gets too cold outside and raises her left arm. Pt was sent home from work due to pain. Pt has tried OTC ibuprofen and alieve to alleviate the pain with minimal relief.  Pt denies heavy lifting, exercising, numbness in groin, bowel/bladder incontinence, weight loss, IV drug use, and hx of IV drug use. Pt has a history of substance abuse.   Past Medical History  Diagnosis Date  . Asthma   . Gastroparesis   . Allergy   . Anemia   . Anxiety   . Depression   . Substance abuse   . Scoliosis    History reviewed. No pertinent past surgical history. Family History  Problem Relation Age of Onset  . Hyperlipidemia Mother   . Hyperlipidemia Father   . Stroke Father   . Mental illness Sister   . Cancer Paternal Grandfather   . Hyperlipidemia Paternal Grandfather    History  Substance Use Topics  . Smoking status: Current Every Day Smoker -- 0.50 packs/day    Types: Cigarettes  . Smokeless tobacco: Not on file  . Alcohol Use: Yes   OB History    No data available     Review of Systems  All other systems reviewed and are negative.   A complete 10 system review of systems was obtained and all systems are negative  except as noted in the HPI and PMH.    Allergies  Hydrocodone and Milk-related compounds  Home Medications   Prior to Admission medications   Medication Sig Start Date End Date Taking? Authorizing Provider  FLUoxetine (PROZAC) 20 MG tablet Take 1 tablet (20 mg total) by mouth daily. 09/17/14  Yes Ofilia Neas, PA-C  ibuprofen (ADVIL,MOTRIN) 600 MG tablet Take 1 tablet (600 mg total) by mouth every 6 (six) hours as needed. 10/30/14   Eyvonne Mechanic, PA-C   Triage vitals: BP 114/70 mmHg  Pulse 75  Temp(Src) 98.2 F (36.8 C) (Oral)  Resp 16  SpO2 100%  LMP 10/30/2014  Physical Exam  Constitutional: She is oriented to person, place, and time. She appears well-developed and well-nourished. No distress.  HENT:  Head: Normocephalic and atraumatic.  Mouth/Throat: Oropharynx is clear and moist.  Eyes: Conjunctivae and EOM are normal.  Neck: Normal range of motion. Neck supple.  Cardiovascular: Normal rate, regular rhythm and normal heart sounds.   Pulmonary/Chest: Effort normal and breath sounds normal. No respiratory distress.  Musculoskeletal: Normal range of motion. She exhibits no edema.  No c spine, l spine , t spine tenderness No signs of obvious trauma or deformity Minimally Tenderness to palpation to left lower back Minimal tenderness to left gluteus  bilateral distal extremities, strength 5/5, perfusion intact Patellar reflexes 2+ full active ROM, ambulates w/o difficulty, full forward flexion of hips pain free. Straight leg raise negative.  Neurological: She is alert and oriented to person, place, and time. No sensory deficit.  Skin: Skin is warm and dry.  Psychiatric: She has a normal mood and affect. Her behavior is normal.  Nursing note and vitals reviewed.   ED Course  Procedures (including critical care time) Labs Review Labs Reviewed - No data to display  Imaging Review No results found.   EKG Interpretation None     MDM   Final diagnoses:  Left-sided  low back pain without sciatica   Labs: none  Imaging: none  Consults: none  Therapeutics: none  Assessment/ Plan: Patient presents with acute on chronic lower back pain. She has no red flags, ambulates without difficulty full active range of motion minimally tender to palpation. This is likely soft tissue related, she does not have any radiation of symptoms, no need for further diagnostic evaluation at this time. Patient requests work note that she was sent home from work today for back pain, patient given a work note, instructions to use Ibuprofen for 5 days, 3x a day. Heat and rest. Info on [place] and wellness if pain persists after 2 weeks. Strict return precautions given, patient verbalized understanding and agreement for today's plan and had no further questions concerns at time of discharge    I personally performed the services described in this documentation, which was scribed in my presence. The recorded information has been reviewed and is accurate.    Eyvonne Mechanic, PA-C 10/30/14 [id number]  Purvis Sheffield, MD 10/30/14 [phone]

## 2014-10-30 NOTE — Discharge Instructions (Signed)
Back Exercises These exercises may help you when beginning to rehabilitate your injury. Your symptoms may resolve with or without further involvement from your physician, physical therapist or athletic trainer. While completing these exercises, remember:   Restoring tissue flexibility helps normal motion to return to the joints. This allows healthier, less painful movement and activity.  An effective stretch should be held for at least 30 seconds.  A stretch should never be painful. You should only feel a gentle lengthening or release in the stretched tissue. STRETCH - Extension, Prone on Elbows   Lie on your stomach on the floor, a bed will be too soft. Place your palms about shoulder width apart and at the height of your head.  Place your elbows under your shoulders. If this is too painful, stack pillows under your chest.  Allow your body to relax so that your hips drop lower and make contact more completely with the floor.  Hold this position for __________ seconds.  Slowly return to lying flat on the floor. Repeat __________ times. Complete this exercise __________ times per day.  RANGE OF MOTION - Extension, Prone Press Ups   Lie on your stomach on the floor, a bed will be too soft. Place your palms about shoulder width apart and at the height of your head.  Keeping your back as relaxed as possible, slowly straighten your elbows while keeping your hips on the floor. You may adjust the placement of your hands to maximize your comfort. As you gain motion, your hands will come more underneath your shoulders.  Hold this position __________ seconds.  Slowly return to lying flat on the floor. Repeat __________ times. Complete this exercise __________ times per day.  RANGE OF MOTION- Quadruped, Neutral Spine   Assume a hands and knees position on a firm surface. Keep your hands under your shoulders and your knees under your hips. You may place padding under your knees for  comfort.  Drop your head and point your tail bone toward the ground below you. This will round out your low back like an angry cat. Hold this position for __________ seconds.  Slowly lift your head and release your tail bone so that your back sags into a large arch, like an old horse.  Hold this position for __________ seconds.  Repeat this until you feel limber in your low back.  Now, find your "sweet spot." This will be the most comfortable position somewhere between the two previous positions. This is your neutral spine. Once you have found this position, tense your stomach muscles to support your low back.  Hold this position for __________ seconds. Repeat __________ times. Complete this exercise __________ times per day.  STRETCH - Flexion, Single Knee to Chest   Lie on a firm bed or floor with both legs extended in front of you.  Keeping one leg in contact with the floor, bring your opposite knee to your chest. Hold your leg in place by either grabbing behind your thigh or at your knee.  Pull until you feel a gentle stretch in your low back. Hold __________ seconds.  Slowly release your grasp and repeat the exercise with the opposite side. Repeat __________ times. Complete this exercise __________ times per day.  STRETCH - Hamstrings, Standing  Stand or sit and extend your right / left leg, placing your foot on a chair or foot stool  Keeping a slight arch in your low back and your hips straight forward.  Lead with your chest and  lean forward at the waist until you feel a gentle stretch in the back of your right / left knee or thigh. (When done correctly, this exercise requires leaning only a small distance.)  Hold this position for __________ seconds. Repeat __________ times. Complete this stretch __________ times per day. STRENGTHENING - Deep Abdominals, Pelvic Tilt   Lie on a firm bed or floor. Keeping your legs in front of you, bend your knees so they are both pointed  toward the ceiling and your feet are flat on the floor.  Tense your lower abdominal muscles to press your low back into the floor. This motion will rotate your pelvis so that your tail bone is scooping upwards rather than pointing at your feet or into the floor.  With a gentle tension and even breathing, hold this position for __________ seconds. Repeat __________ times. Complete this exercise __________ times per day.  STRENGTHENING - Abdominals, Crunches   Lie on a firm bed or floor. Keeping your legs in front of you, bend your knees so they are both pointed toward the ceiling and your feet are flat on the floor. Cross your arms over your chest.  Slightly tip your chin down without bending your neck.  Tense your abdominals and slowly lift your trunk high enough to just clear your shoulder blades. Lifting higher can put excessive stress on the low back and does not further strengthen your abdominal muscles.  Control your return to the starting position. Repeat __________ times. Complete this exercise __________ times per day.  STRENGTHENING - Quadruped, Opposite UE/LE Lift   Assume a hands and knees position on a firm surface. Keep your hands under your shoulders and your knees under your hips. You may place padding under your knees for comfort.  Find your neutral spine and gently tense your abdominal muscles so that you can maintain this position. Your shoulders and hips should form a rectangle that is parallel with the floor and is not twisted.  Keeping your trunk steady, lift your right hand no higher than your shoulder and then your left leg no higher than your hip. Make sure you are not holding your breath. Hold this position __________ seconds.  Continuing to keep your abdominal muscles tense and your back steady, slowly return to your starting position. Repeat with the opposite arm and leg. Repeat __________ times. Complete this exercise __________ times per day. Document Released:  05/26/2005 Document Revised: 07/31/2011 Document Reviewed: 08/20/2008 [facility] Patient Information 2015 [place], [place]. This information is not intended to replace advice given to you by your health care provider. Make sure you discuss any questions you have with your health care provider.  Please monitor for new or worsening signs or symptoms, return to the ED if any present. Please follow-up with [facility] symptoms continue to persist. Ibuprofen or Tylenol as needed for pain.

## 2014-11-09 ENCOUNTER — Ambulatory Visit: Payer: Self-pay | Admitting: Physician Assistant

## 2016-04-20 ENCOUNTER — Encounter (HOSPITAL_BASED_OUTPATIENT_CLINIC_OR_DEPARTMENT_OTHER): Payer: Self-pay | Admitting: *Deleted

## 2016-04-20 ENCOUNTER — Emergency Department (HOSPITAL_BASED_OUTPATIENT_CLINIC_OR_DEPARTMENT_OTHER)
Admission: EM | Admit: 2016-04-20 | Discharge: 2016-04-20 | Disposition: A | Discharge status: Home / Self Care | Payer: No Typology Code available for payment source | Attending: Physician Assistant | Admitting: Physician Assistant

## 2016-04-20 DIAGNOSIS — B373 Candidiasis of vulva and vagina: Secondary | ICD-10-CM | POA: Insufficient documentation

## 2016-04-20 DIAGNOSIS — F17210 Nicotine dependence, cigarettes, uncomplicated: Secondary | ICD-10-CM | POA: Insufficient documentation

## 2016-04-20 DIAGNOSIS — B3731 Acute candidiasis of vulva and vagina: Secondary | ICD-10-CM

## 2016-04-20 DIAGNOSIS — J45909 Unspecified asthma, uncomplicated: Secondary | ICD-10-CM | POA: Insufficient documentation

## 2016-04-20 LAB — URINALYSIS, ROUTINE W REFLEX MICROSCOPIC
BILIRUBIN URINE: NEGATIVE
Glucose, UA: NEGATIVE mg/dL
Hgb urine dipstick: NEGATIVE
KETONES UR: NEGATIVE mg/dL
Leukocytes, UA: NEGATIVE
NITRITE: NEGATIVE
PH: 6.5 (ref 5.0–8.0)
Protein, ur: NEGATIVE mg/dL
Specific Gravity, Urine: 1.02 (ref 1.005–1.030)

## 2016-04-20 LAB — WET PREP, GENITAL
Clue Cells Wet Prep HPF POC: NONE SEEN
Sperm: NONE SEEN
Trich, Wet Prep: NONE SEEN
YEAST WET PREP: NONE SEEN

## 2016-04-20 LAB — PREGNANCY, URINE: Preg Test, Ur: NEGATIVE

## 2016-04-20 MED ORDER — FLUCONAZOLE 50 MG PO TABS
150.0000 mg | ORAL_TABLET | Freq: Once | ORAL | Status: AC
  Administered 2016-04-20: 150 mg via ORAL
  Filled 2016-04-20: qty 1

## 2016-04-20 NOTE — Discharge Instructions (Signed)
You were treated for vaginal yeast infection today. If your symptoms do not go away please return immediately. You will be contacted if any of your other tests are found to be positive including GC, chlamydia, HIV.

## 2016-04-20 NOTE — ED Provider Notes (Signed)
[facility] Provider Note   CSN: [id number] Arrival date & time: 04/20/16  [id number]     History   Chief Complaint Chief Complaint  Patient presents with  . Vaginal Discharge    HPI Samantha Casey is a 25 y.o. female.  HPI   Patient's very pleasant 25 year old female presenting with 1 month of vaginal discharge. She reports is white and chunky. She reports she has mild itching mild malodorous discharge. She denies any abdominal pain, nausea, vomiting, fever, other complaints.   Past Medical History:  Diagnosis Date  . Allergy   . Anemia   . Anxiety   . Asthma   . Depression   . Gastroparesis   . Scoliosis   . Substance abuse     There are no active problems to display for this patient.   History reviewed. No pertinent surgical history.  OB History    No data available       Home Medications    Prior to Admission medications   Medication Sig Start Date End Date Taking? Authorizing Provider  ibuprofen (ADVIL,MOTRIN) 600 MG tablet Take 1 tablet (600 mg total) by mouth every 6 (six) hours as needed. 10/30/14   Eyvonne MechanicJeffrey Hedges, PA-C    Family History Family History  Problem Relation Age of Onset  . Hyperlipidemia Mother   . Hyperlipidemia Father   . Stroke Father   . Mental illness Sister   . Cancer Paternal Grandfather   . Hyperlipidemia Paternal Grandfather     Social History Social History  Substance Use Topics  . Smoking status: Current Every Day Smoker    Packs/day: 0.50    Types: Cigarettes  . Smokeless tobacco: Not on file  . Alcohol use Yes     Allergies   Hydrocodone and Milk-related compounds   Review of Systems Review of Systems  Constitutional: Negative for chills, fatigue and fever.  Genitourinary: Negative for flank pain, frequency, hematuria, menstrual problem, pelvic pain, vaginal bleeding and vaginal pain.  All other systems reviewed and are negative.    Physical Exam Updated Vital Signs BP 128/90 (BP  Location: Left Arm)   Pulse 68   Temp 98.3 F (36.8 C)   Resp 18   Ht 5\' 4"  (1.626 m)   Wt 138 lb (62.6 kg)   LMP 03/22/2016   SpO2 96%   BMI 23.69 kg/m   Physical Exam  Constitutional: She is oriented to person, place, and time. She appears well-developed and well-nourished.  HENT:  Head: Normocephalic and atraumatic.  Eyes: Right eye exhibits no discharge.  Cardiovascular: Normal rate, regular rhythm and normal heart sounds.   No murmur heard. Pulmonary/Chest: Effort normal and breath sounds normal. She has no wheezes. She has no rales.  Abdominal: Soft. She exhibits no distension. There is no tenderness.  Genitourinary: Vagina normal.  Genitourinary Comments: Thick white chunky discharge, consistent with yeast.  Neurological: She is oriented to person, place, and time.  Skin: Skin is warm and dry. She is not diaphoretic.  Psychiatric: She has a normal mood and affect.  Nursing note and vitals reviewed.    ED Treatments / Results  Labs (all labs ordered are listed, but only abnormal results are displayed) Labs Reviewed  WET PREP, GENITAL - Abnormal; Notable for the following:       Result Value   WBC, Wet Prep HPF POC FEW (*)    All other components within normal limits  PREGNANCY, URINE  URINALYSIS, ROUTINE W REFLEX MICROSCOPIC (NOT AT [facility])  RPR  HIV ANTIBODY (ROUTINE TESTING)  GC/CHLAMYDIA PROBE AMP () NOT AT [facility]    EKG  EKG Interpretation None       Radiology No results found.  Procedures Procedures (including critical care time)  Medications Ordered in ED Medications  fluconazole (DIFLUCAN) tablet 150 mg (150 mg Oral Given 04/20/16 1050)     Initial Impression / Assessment and Plan / ED Course  I have reviewed the triage vital signs and the nursing notes.  Pertinent labs & imaging results that were available during my care of the patient were reviewed by me and considered in my medical decision making (see chart for  details).  Clinical Course    Patient is a 25 year old female presenting with chunky white discharge and vaginal itching. He reports and went over the last month. Wet prep showed no evidence of anything. However physical exam findings were so consistent with yeast infection I will go ahead and treat. We will have patient follow up with primary care physician as needed.  GC chlamydia HIV and RPR pending.  Final Clinical Impressions(s) / ED Diagnoses   Final diagnoses:  None    New Prescriptions New Prescriptions   No medications on file     Marguetta Windish Randall AnLyn Dione Petron, MD 04/20/16 1054

## 2016-04-20 NOTE — ED Triage Notes (Signed)
Pt c/o vaginal discharge x 1 month 

## 2016-04-21 LAB — GC/CHLAMYDIA PROBE AMP (~~LOC~~) NOT AT ARMC
Chlamydia: NEGATIVE
Neisseria Gonorrhea: NEGATIVE

## 2016-04-21 LAB — RPR: RPR: NONREACTIVE

## 2016-04-21 LAB — HIV ANTIBODY (ROUTINE TESTING W REFLEX): HIV SCREEN 4TH GENERATION: NONREACTIVE

## 2016-12-06 DIAGNOSIS — F418 Other specified anxiety disorders: Secondary | ICD-10-CM | POA: Diagnosis present

## 2016-12-06 DIAGNOSIS — J45909 Unspecified asthma, uncomplicated: Secondary | ICD-10-CM | POA: Diagnosis present

## 2016-12-07 LAB — AMB REFERRAL TO OB-GYN

## 2016-12-30 DIAGNOSIS — R195 Other fecal abnormalities: Secondary | ICD-10-CM | POA: Diagnosis not present

## 2016-12-30 DIAGNOSIS — R1084 Generalized abdominal pain: Secondary | ICD-10-CM | POA: Diagnosis not present

## 2016-12-30 DIAGNOSIS — G43019 Migraine without aura, intractable, without status migrainosus: Secondary | ICD-10-CM | POA: Diagnosis not present

## 2016-12-30 DIAGNOSIS — R51 Headache: Secondary | ICD-10-CM | POA: Diagnosis not present

## 2017-02-06 DIAGNOSIS — R197 Diarrhea, unspecified: Secondary | ICD-10-CM | POA: Diagnosis not present

## 2017-02-06 DIAGNOSIS — R112 Nausea with vomiting, unspecified: Secondary | ICD-10-CM | POA: Diagnosis not present

## 2017-02-06 DIAGNOSIS — R1084 Generalized abdominal pain: Secondary | ICD-10-CM | POA: Diagnosis not present

## 2017-02-06 DIAGNOSIS — J029 Acute pharyngitis, unspecified: Secondary | ICD-10-CM | POA: Diagnosis not present

## 2017-03-27 ENCOUNTER — Emergency Department (HOSPITAL_COMMUNITY)
Admission: EM | Admit: 2017-03-27 | Discharge: 2017-03-27 | Disposition: A | Discharge status: Home / Self Care | Payer: BLUE CROSS/BLUE SHIELD | Attending: Emergency Medicine | Admitting: Emergency Medicine

## 2017-03-27 DIAGNOSIS — Z5321 Procedure and treatment not carried out due to patient leaving prior to being seen by health care provider: Secondary | ICD-10-CM | POA: Diagnosis not present

## 2017-03-27 DIAGNOSIS — N939 Abnormal uterine and vaginal bleeding, unspecified: Secondary | ICD-10-CM | POA: Insufficient documentation

## 2017-03-27 NOTE — ED Triage Notes (Signed)
Onset approximately 5 hrs ago, patient reports severe bleeding from vaginal area. (+) abdominal pain favoring LUQ, nausea, dizziness and SHOB. Patient report LMP 1 week ago. Pain 7/10.

## 2017-03-27 NOTE — ED Notes (Signed)
Unable to collect labs I called patient name in the lobby and no one responded 

## 2017-03-27 NOTE — ED Notes (Signed)
Pt called again from triage with no answer 

## 2017-03-27 NOTE — ED Notes (Signed)
No answer when called for room 

## 2017-03-28 ENCOUNTER — Emergency Department (HOSPITAL_BASED_OUTPATIENT_CLINIC_OR_DEPARTMENT_OTHER)
Admission: EM | Admit: 2017-03-28 | Discharge: 2017-03-28 | Disposition: A | Discharge status: Home / Self Care | Payer: BLUE CROSS/BLUE SHIELD | Attending: Emergency Medicine | Admitting: Emergency Medicine

## 2017-03-28 ENCOUNTER — Other Ambulatory Visit: Payer: Self-pay

## 2017-03-28 ENCOUNTER — Encounter (HOSPITAL_BASED_OUTPATIENT_CLINIC_OR_DEPARTMENT_OTHER): Payer: Self-pay

## 2017-03-28 DIAGNOSIS — F17210 Nicotine dependence, cigarettes, uncomplicated: Secondary | ICD-10-CM | POA: Diagnosis not present

## 2017-03-28 DIAGNOSIS — N939 Abnormal uterine and vaginal bleeding, unspecified: Secondary | ICD-10-CM | POA: Diagnosis not present

## 2017-03-28 DIAGNOSIS — J45909 Unspecified asthma, uncomplicated: Secondary | ICD-10-CM | POA: Diagnosis not present

## 2017-03-28 DIAGNOSIS — D649 Anemia, unspecified: Secondary | ICD-10-CM | POA: Diagnosis not present

## 2017-03-28 LAB — URINALYSIS, ROUTINE W REFLEX MICROSCOPIC
Bilirubin Urine: NEGATIVE
Glucose, UA: NEGATIVE mg/dL
Ketones, ur: NEGATIVE mg/dL
Nitrite: NEGATIVE
Protein, ur: NEGATIVE mg/dL
Specific Gravity, Urine: 1.01 (ref 1.005–1.030)
pH: 6 (ref 5.0–8.0)

## 2017-03-28 LAB — CBC WITH DIFFERENTIAL/PLATELET
Basophils Absolute: 0 K/uL (ref 0.0–0.1)
Basophils Relative: 0 %
Eosinophils Absolute: 0.2 K/uL (ref 0.0–0.7)
Eosinophils Relative: 3 %
HCT: 26.9 % — ABNORMAL LOW (ref 36.0–46.0)
Hemoglobin: 8.9 g/dL — ABNORMAL LOW (ref 12.0–15.0)
Lymphocytes Relative: 30 %
Lymphs Abs: 1.6 K/uL (ref 0.7–4.0)
MCH: 29.4 pg (ref 26.0–34.0)
MCHC: 33.1 g/dL (ref 30.0–36.0)
MCV: 88.8 fL (ref 78.0–100.0)
Monocytes Absolute: 0.5 K/uL (ref 0.1–1.0)
Monocytes Relative: 10 %
Neutro Abs: 2.9 K/uL (ref 1.7–7.7)
Neutrophils Relative %: 57 %
Platelets: 226 K/uL (ref 150–400)
RBC: 3.03 MIL/uL — ABNORMAL LOW (ref 3.87–5.11)
RDW: 12.8 % (ref 11.5–15.5)
WBC: 5.2 K/uL (ref 4.0–10.5)

## 2017-03-28 LAB — PREGNANCY, URINE: PREG TEST UR: NEGATIVE

## 2017-03-28 LAB — WET PREP, GENITAL
CLUE CELLS WET PREP: NONE SEEN
SPERM: NONE SEEN
Trich, Wet Prep: NONE SEEN
Yeast Wet Prep HPF POC: NONE SEEN

## 2017-03-28 LAB — URINALYSIS, MICROSCOPIC (REFLEX)

## 2017-03-28 MED ORDER — KETOROLAC TROMETHAMINE 15 MG/ML IJ SOLN
15.0000 mg | Freq: Once | INTRAMUSCULAR | Status: AC
  Administered 2017-03-28: 15 mg via INTRAMUSCULAR
  Filled 2017-03-28: qty 1

## 2017-03-28 MED ORDER — FERROUS SULFATE 325 (65 FE) MG PO TABS: 325.0000 mg | ORAL_TABLET | Freq: Every day | ORAL | 0 refills | Status: DC

## 2017-03-28 MED ORDER — NORGESTIMATE-ETH ESTRADIOL 0.25-35 MG-MCG PO TABS: 1.0000 | ORAL_TABLET | Freq: Every day | ORAL | 11 refills | Status: DC

## 2017-03-28 NOTE — ED Triage Notes (Signed)
Pt reports heavy vaginal bleeding that started yesterday. Reports pain 8/10. Feels weak.
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
## 2017-03-28 NOTE — ED Notes (Signed)
Pt asking for pain medication. PA made aware.

## 2017-03-28 NOTE — Discharge Instructions (Signed)
It was my pleasure taking care of you today!   Please start taking birth control today. This should help with bleeding.  Please call the women's health clinic listed today to schedule a follow up appointment.   Return to ER immediately for bleeding 1 pad per hour, new or worsening symptoms, any additional concerns.

## 2017-03-28 NOTE — ED Notes (Signed)
PA at bedside.

## 2017-03-29 LAB — GC/CHLAMYDIA PROBE AMP (~~LOC~~) NOT AT ARMC
Chlamydia: NEGATIVE
NEISSERIA GONORRHEA: NEGATIVE

## 2017-06-06 DIAGNOSIS — N63 Unspecified lump in unspecified breast: Secondary | ICD-10-CM | POA: Diagnosis not present

## 2017-06-07 ENCOUNTER — Emergency Department (HOSPITAL_COMMUNITY)
Admission: EM | Admit: 2017-06-07 | Discharge: 2017-06-07 | Disposition: A | Discharge status: Home / Self Care | Payer: BLUE CROSS/BLUE SHIELD | Attending: Emergency Medicine | Admitting: Emergency Medicine

## 2017-06-07 ENCOUNTER — Encounter (HOSPITAL_COMMUNITY): Payer: Self-pay | Admitting: Emergency Medicine

## 2017-06-07 DIAGNOSIS — J45909 Unspecified asthma, uncomplicated: Secondary | ICD-10-CM | POA: Diagnosis not present

## 2017-06-07 DIAGNOSIS — N611 Abscess of the breast and nipple: Secondary | ICD-10-CM | POA: Insufficient documentation

## 2017-06-07 DIAGNOSIS — R590 Localized enlarged lymph nodes: Secondary | ICD-10-CM | POA: Diagnosis not present

## 2017-06-07 DIAGNOSIS — N6322 Unspecified lump in the left breast, upper inner quadrant: Secondary | ICD-10-CM | POA: Diagnosis not present

## 2017-06-07 DIAGNOSIS — F17210 Nicotine dependence, cigarettes, uncomplicated: Secondary | ICD-10-CM | POA: Insufficient documentation

## 2017-06-07 DIAGNOSIS — N644 Mastodynia: Secondary | ICD-10-CM | POA: Diagnosis not present

## 2017-06-07 DIAGNOSIS — Z793 Long term (current) use of hormonal contraceptives: Secondary | ICD-10-CM | POA: Diagnosis not present

## 2017-06-07 DIAGNOSIS — N6324 Unspecified lump in the left breast, lower inner quadrant: Secondary | ICD-10-CM | POA: Diagnosis not present

## 2017-06-07 LAB — COMPREHENSIVE METABOLIC PANEL
ALBUMIN: 3.6 g/dL (ref 3.5–5.0)
ALT: 10 U/L — ABNORMAL LOW (ref 14–54)
AST: 15 U/L (ref 15–41)
Alkaline Phosphatase: 67 U/L (ref 38–126)
Anion gap: 8 (ref 5–15)
BILIRUBIN TOTAL: 0.2 mg/dL — AB (ref 0.3–1.2)
BUN: 8 mg/dL (ref 6–20)
CHLORIDE: 103 mmol/L (ref 101–111)
CO2: 26 mmol/L (ref 22–32)
Calcium: 9.1 mg/dL (ref 8.9–10.3)
Creatinine, Ser: 0.71 mg/dL (ref 0.44–1.00)
GFR calc Af Amer: >60 mL/min (ref >60)
GFR calc non Af Amer: >60 mL/min (ref >60)
GLUCOSE: 95 mg/dL (ref 65–99)
POTASSIUM: 3.5 mmol/L (ref 3.5–5.1)
SODIUM: 137 mmol/L (ref 135–145)
Total Protein: 7.2 g/dL (ref 6.5–8.1)

## 2017-06-07 LAB — CBC WITH DIFFERENTIAL/PLATELET
Basophils Absolute: 0 10*3/uL (ref 0.0–0.1)
Basophils Relative: 0 %
EOS PCT: 1 %
Eosinophils Absolute: 0.1 10*3/uL (ref 0.0–0.7)
HEMATOCRIT: 35.9 % — AB (ref 36.0–46.0)
Hemoglobin: 11.7 g/dL — ABNORMAL LOW (ref 12.0–15.0)
LYMPHS ABS: 1.5 10*3/uL (ref 0.7–4.0)
LYMPHS PCT: 17 %
MCH: 28 pg (ref 26.0–34.0)
MCHC: 32.6 g/dL (ref 30.0–36.0)
MCV: 85.9 fL (ref 78.0–100.0)
MONOS PCT: 11 %
Monocytes Absolute: 1 10*3/uL (ref 0.1–1.0)
NEUTROS ABS: 6.3 10*3/uL (ref 1.7–7.7)
Neutrophils Relative %: 71 %
PLATELETS: 310 10*3/uL (ref 150–400)
RBC: 4.18 MIL/uL (ref 3.87–5.11)
RDW: 12.6 % (ref 11.5–15.5)
WBC: 8.9 10*3/uL (ref 4.0–10.5)

## 2017-06-07 LAB — I-STAT BETA HCG BLOOD, ED (MC, WL, AP ONLY): I-stat hCG, quantitative: <5 m[IU]/mL (ref <5)

## 2017-06-07 MED ORDER — ONDANSETRON HCL 4 MG/2ML IJ SOLN: 4.0000 mg | Freq: Once | INTRAMUSCULAR | Status: DC

## 2017-06-07 MED ORDER — CLINDAMYCIN HCL 300 MG PO CAPS
450.0000 mg | ORAL_CAPSULE | Freq: Once | ORAL | Status: AC
  Administered 2017-06-07: 450 mg via ORAL
  Filled 2017-06-07: qty 1

## 2017-06-07 MED ORDER — ONDANSETRON HCL 4 MG/2ML IJ SOLN
4.0000 mg | Freq: Once | INTRAMUSCULAR | Status: DC
Start: 2017-06-07 — End: 2017-06-07
  Administered 2017-06-07: 4 mg via INTRAVENOUS
  Filled 2017-06-07: qty 2

## 2017-06-07 MED ORDER — MORPHINE SULFATE (PF) 4 MG/ML IV SOLN: 4.0000 mg | Freq: Once | INTRAVENOUS | Status: DC

## 2017-06-07 MED ORDER — MORPHINE SULFATE (PF) 4 MG/ML IV SOLN
4.0000 mg | Freq: Once | INTRAVENOUS | Status: DC
  Administered 2017-06-07: 4 mg via INTRAVENOUS
  Filled 2017-06-07: qty 1

## 2017-06-07 MED ORDER — ONDANSETRON 4 MG PO TBDP: 4.0000 mg | ORAL_TABLET | Freq: Three times a day (TID) | ORAL | 0 refills | Status: DC | PRN

## 2017-06-07 MED ORDER — HYDROCODONE-ACETAMINOPHEN 5-325 MG PO TABS: 1.0000 | ORAL_TABLET | Freq: Four times a day (QID) | ORAL | 0 refills | Status: DC | PRN

## 2017-06-07 MED ORDER — CLINDAMYCIN HCL 150 MG PO CAPS: 450.0000 mg | ORAL_CAPSULE | Freq: Three times a day (TID) | ORAL | 0 refills | Status: AC

## 2017-06-07 NOTE — ED Provider Notes (Signed)
[facility]-EMERGENCY DEPT Provider Note   CSN: [id number] Arrival date & time: 06/07/17  1518     History   Chief Complaint Chief Complaint  Patient presents with  . Abscess    HPI Samantha Casey is a 27 y.o. female who presents today for evaluation.  She was sent here from her PCP this morning to meet with a surgeon as she was diagnosed with a left breast abscess by ultrasound this morning.  She reports that this has been present for approximately 2 weeks.  She denies fevers or chills.  She does report some mild appetite changes.  She denies possibility of pregnancy.  She reports that she has never been pregnant or given birth.  She denies any drainage from her breast.  She reports that there has been redness, pain, and swelling.  She reports that she had some water on her way here, however has not ate anything since last night.    HPI  Past Medical History:  Diagnosis Date  . Allergy   . Anemia   . Anxiety   . Asthma   . Depression   . Gastroparesis   . Scoliosis   . Substance abuse (HCC)     There are no active problems to display for this patient.   History reviewed. No pertinent surgical history.  OB History    No data available       Home Medications    Prior to Admission medications   Medication Sig Start Date End Date Taking? Authorizing Provider  Aspirin-Salicylamide-Caffeine (BC HEADACHE PO) Take 1 packet by mouth daily as needed (BREAST PAIN).   Yes [provider]  clindamycin (CLEOCIN) 150 MG capsule Take 3 capsules (450 mg total) by mouth 3 (three) times daily for 10 days. 06/07/17 06/17/17  Cristina GongHammond, Moris Ratchford W, PA-C  ferrous sulfate 325 (65 FE) MG tablet Take 1 tablet (325 mg total) daily by mouth. Patient not taking: Reported on 06/07/2017 03/28/17   Ward, Chase PicketJaime Pilcher, PA-C  HYDROcodone-acetaminophen (NORCO/VICODIN) 5-325 MG tablet Take 1 tablet by mouth every 6 (six) hours as needed for up to 10 doses. 06/07/17    Cristina GongHammond, Rowe Warman W, PA-C  ibuprofen (ADVIL,MOTRIN) 600 MG tablet Take 1 tablet (600 mg total) by mouth every 6 (six) hours as needed. Patient not taking: Reported on 06/07/2017 10/30/14   Hedges, Tinnie GensJeffrey, PA-C  norgestimate-ethinyl estradiol (ORTHO-CYCLEN,SPRINTEC,PREVIFEM) 0.25-35 MG-MCG tablet Take 1 tablet daily by mouth. Patient not taking: Reported on 06/07/2017 03/28/17   Ward, Chase PicketJaime Pilcher, PA-C  ondansetron (ZOFRAN ODT) 4 MG disintegrating tablet Take 1 tablet (4 mg total) by mouth every 8 (eight) hours as needed for nausea or vomiting. 06/07/17   Cristina GongHammond, Britini Garcilazo W, PA-C    Family History Family History  Problem Relation Age of Onset  . Hyperlipidemia Mother   . Hyperlipidemia Father   . Stroke Father   . Mental illness Sister   . Cancer Paternal Grandfather   . Hyperlipidemia Paternal Grandfather     Social History Social History   Tobacco Use  . Smoking status: Current Every Day Smoker    Packs/day: 0.50    Types: Cigarettes  . Smokeless tobacco: Never Used  Substance Use Topics  . Alcohol use: Yes  . Drug use: No     Allergies   Hydrocodone and Milk-related compounds   Review of Systems Review of Systems  Constitutional: Negative for chills and fever.       Breast pain  HENT: Negative for ear pain and  sore throat.   Eyes: Negative for pain and visual disturbance.  Respiratory: Negative for cough and shortness of breath.   Cardiovascular: Negative for chest pain and palpitations.  Gastrointestinal: Negative for abdominal pain and vomiting.  Genitourinary: Negative for dysuria and hematuria.  Musculoskeletal: Negative for arthralgias and back pain.  Skin: Negative for color change and rash.  Neurological: Negative for seizures and syncope.  All other systems reviewed and are negative.    Physical Exam Updated Vital Signs BP 116/87 (BP Location: Right Arm)   Pulse 75   Temp 98.6 F (37 C) (Oral)   Resp 17   LMP 05/30/2017   SpO2 99%   Physical  Exam  Constitutional: She is oriented to person, place, and time. She appears well-developed and well-nourished. No distress.  HENT:  Head: Normocephalic and atraumatic.  Eyes: Conjunctivae are normal.  Neck: Neck supple.  Cardiovascular: Normal rate and regular rhythm.  No murmur heard. Pulmonary/Chest: Effort normal and breath sounds normal. No respiratory distress. Left breast exhibits mass, skin change and tenderness. Left breast exhibits no inverted nipple and no nipple discharge.  Left breast is very tender to palpation, there is edema and erythema.    Abdominal: Soft. There is no tenderness.  Musculoskeletal: She exhibits no edema.  Neurological: She is alert and oriented to person, place, and time.  Skin: Skin is warm and dry.  Psychiatric: She has a normal mood and affect.  Nursing note and vitals reviewed.    ED Treatments / Results  Labs (all labs ordered are listed, but only abnormal results are displayed) Labs Reviewed  COMPREHENSIVE METABOLIC PANEL - Abnormal; Notable for the following components:      Result Value   ALT 10 (*)    Total Bilirubin 0.2 (*)    All other components within normal limits  CBC WITH DIFFERENTIAL/PLATELET - Abnormal; Notable for the following components:   Hemoglobin 11.7 (*)    HCT 35.9 (*)    All other components within normal limits  I-STAT BETA HCG BLOOD, ED ([facility], [facility], [facility] ONLY)    EKG  EKG Interpretation None       Radiology No results found.  Procedures Procedures (including critical care time)  Medications Ordered in ED Medications  clindamycin (CLEOCIN) capsule 450 mg (450 mg Oral Given 06/07/17 1854)     Initial Impression / Assessment and Plan / ED Course  I have reviewed the triage vital signs and the nursing notes.  Pertinent labs & imaging results that were available during my care of the patient were reviewed by me and considered in my medical decision making (see chart for details).  Clinical Course as of Jun 08 140  Thu Jun 07, 2017  9604 Patient updated on plan.   [EH]    Clinical Course User Index [EH] Cristina Gong, PA-C   Samantha Mins presents today for evaluation of breast abscess diagnosed on ultrasound earlier today.  Basic screening labs were obtained, patient does not meet Sirs or sepsis criteria.  Patient does have mild erythema of the affected breast without obvious drainage or induration.  I spoke with general surgery who reports that patient needs to follow-up with the breast center.  Patient was given a prescription for Clindamycin, pain medicine, and Zofran she was given her first dose of clindamycin while in the emergency room.Marland Kitchen  She was given the contact information for the breast center, and advised that first thing tomorrow morning she needs to call them to set up  an appointment.  She was informed that she may have to involve her PCP in the care and placing orders.  As breast center is currently closed, unable to send her there directly.  Patient was given return precautions, and states her understanding.  She is afebrile, not tachycardic, and appears hemodynamically safe for discharge with appropriate antibiotic therapy and follow-up or definitive drainage/treatment.  This patient was discussed with Dr. Clarene Duke who agreed with my plan.  Final Clinical Impressions(s) / ED Diagnoses   Final diagnoses:  Breast abscess of female    ED Discharge Orders        Ordered    HYDROcodone-acetaminophen (NORCO/VICODIN) 5-325 MG tablet  Every 6 hours PRN     06/07/17 1901    ondansetron (ZOFRAN ODT) 4 MG disintegrating tablet  Every 8 hours PRN     06/07/17 1901    clindamycin (CLEOCIN) 150 MG capsule  3 times daily     06/07/17 1901       Norman Clay 06/08/17 0145    Little, Ambrose Finland, MD 06/08/17 [phone]

## 2017-06-07 NOTE — Discharge Instructions (Signed)
Please take Ibuprofen (Advil, motrin) and Tylenol (acetaminophen) to relieve your pain.  You may take up to 600 MG (3 pills) of normal strength ibuprofen every 8 hours as needed.  In between doses of ibuprofen you make take tylenol, up to 1,000 mg (two extra strength pills).  Do not take more than 3,000 mg tylenol in a 24 hour period.  Please check all medication labels as many medications such as pain and cold medications may contain tylenol.  Do not drink alcohol while taking these medications.  Do not take other NSAID'S while taking ibuprofen (such as aleve or naproxen).  Please take ibuprofen with food to decrease stomach upset. ° °Today you received medications that may make you sleepy or impair your ability to make decisions.  For the next 24 hours please do not drive, operate heavy machinery, care for a small child with out another adult present, or perform any activities that may cause harm to you or someone else if you were to fall asleep or be impaired.  ° °You are being prescribed a medication which may make you sleepy. Please follow up of listed precautions for at least 24 hours after taking one dose. ° °You may have diarrhea from the antibiotics.  It is very important that you continue to take the antibiotics even if you get diarrhea unless a medical professional tells you that you may stop taking them.  If you stop too early the bacteria you are being treated for will become stronger and you may need different, more powerful antibiotics that have more side effects and worsening diarrhea.  Please stay well hydrated and consider probiotics as they may decrease the severity of your diarrhea.  Please be aware that if you take any hormonal contraception (birth control pills, nexplanon, the ring, etc) that your birth control will not work while you are taking antibiotics and you need to use back up protection as directed on the birth control medication information insert.  ° °

## 2017-06-07 NOTE — ED Triage Notes (Signed)
Patient reports she was sent from PCP to meet with surgeon regarding abscess on left breast x2 weeks. [facility]

## 2017-06-11 ENCOUNTER — Telehealth: Payer: Self-pay | Admitting: Medical Oncology

## 2017-06-11 NOTE — Telephone Encounter (Signed)
Returned pt call -no answer. 

## 2017-06-12 DIAGNOSIS — N611 Abscess of the breast and nipple: Secondary | ICD-10-CM | POA: Diagnosis not present

## 2017-09-11 DIAGNOSIS — A09 Infectious gastroenteritis and colitis, unspecified: Secondary | ICD-10-CM | POA: Diagnosis not present

## 2017-09-11 DIAGNOSIS — E860 Dehydration: Secondary | ICD-10-CM | POA: Diagnosis not present

## 2017-09-11 DIAGNOSIS — R05 Cough: Secondary | ICD-10-CM | POA: Diagnosis not present

## 2017-09-11 DIAGNOSIS — R062 Wheezing: Secondary | ICD-10-CM | POA: Diagnosis not present

## 2017-11-20 ENCOUNTER — Emergency Department (HOSPITAL_BASED_OUTPATIENT_CLINIC_OR_DEPARTMENT_OTHER)
Admission: EM | Admit: 2017-11-20 | Discharge: 2017-11-20 | Disposition: A | Discharge status: Home / Self Care | Payer: BLUE CROSS/BLUE SHIELD | Attending: Emergency Medicine | Admitting: Emergency Medicine

## 2017-11-20 ENCOUNTER — Encounter (HOSPITAL_BASED_OUTPATIENT_CLINIC_OR_DEPARTMENT_OTHER): Payer: Self-pay

## 2017-11-20 ENCOUNTER — Other Ambulatory Visit: Payer: Self-pay

## 2017-11-20 DIAGNOSIS — Z79899 Other long term (current) drug therapy: Secondary | ICD-10-CM | POA: Insufficient documentation

## 2017-11-20 DIAGNOSIS — N760 Acute vaginitis: Secondary | ICD-10-CM | POA: Insufficient documentation

## 2017-11-20 DIAGNOSIS — B9689 Other specified bacterial agents as the cause of diseases classified elsewhere: Secondary | ICD-10-CM

## 2017-11-20 DIAGNOSIS — F17210 Nicotine dependence, cigarettes, uncomplicated: Secondary | ICD-10-CM | POA: Diagnosis not present

## 2017-11-20 DIAGNOSIS — Z3201 Encounter for pregnancy test, result positive: Secondary | ICD-10-CM | POA: Diagnosis not present

## 2017-11-20 DIAGNOSIS — A599 Trichomoniasis, unspecified: Secondary | ICD-10-CM

## 2017-11-20 DIAGNOSIS — N898 Other specified noninflammatory disorders of vagina: Secondary | ICD-10-CM | POA: Diagnosis not present

## 2017-11-20 DIAGNOSIS — A5901 Trichomonal vulvovaginitis: Secondary | ICD-10-CM | POA: Insufficient documentation

## 2017-11-20 DIAGNOSIS — J45909 Unspecified asthma, uncomplicated: Secondary | ICD-10-CM | POA: Insufficient documentation

## 2017-11-20 DIAGNOSIS — O23599 Infection of other part of genital tract in pregnancy, unspecified trimester: Secondary | ICD-10-CM | POA: Diagnosis not present

## 2017-11-20 DIAGNOSIS — Z331 Pregnant state, incidental: Secondary | ICD-10-CM | POA: Diagnosis not present

## 2017-11-20 DIAGNOSIS — O98319 Other infections with a predominantly sexual mode of transmission complicating pregnancy, unspecified trimester: Secondary | ICD-10-CM | POA: Diagnosis not present

## 2017-11-20 LAB — URINALYSIS, MICROSCOPIC (REFLEX)

## 2017-11-20 LAB — URINALYSIS, ROUTINE W REFLEX MICROSCOPIC
Bilirubin Urine: NEGATIVE
GLUCOSE, UA: NEGATIVE mg/dL
Hgb urine dipstick: NEGATIVE
Ketones, ur: NEGATIVE mg/dL
Nitrite: NEGATIVE
PH: 6 (ref 5.0–8.0)
Protein, ur: NEGATIVE mg/dL
Specific Gravity, Urine: <1.005 — ABNORMAL LOW (ref 1.005–1.030)

## 2017-11-20 LAB — WET PREP, GENITAL
Sperm: NONE SEEN
Trich, Wet Prep: NONE SEEN
Yeast Wet Prep HPF POC: NONE SEEN

## 2017-11-20 LAB — PREGNANCY, URINE: Preg Test, Ur: POSITIVE — AB

## 2017-11-20 MED ORDER — LIDOCAINE HCL (PF) 1 % IJ SOLN
INTRAMUSCULAR | Status: AC
  Administered 2017-11-20: 5 mL
  Filled 2017-11-20: qty 5

## 2017-11-20 MED ORDER — METRONIDAZOLE 500 MG PO TABS: 500.0000 mg | ORAL_TABLET | Freq: Two times a day (BID) | ORAL | 0 refills | Status: AC

## 2017-11-20 MED ORDER — CEFTRIAXONE SODIUM 250 MG IJ SOLR
250.0000 mg | Freq: Once | INTRAMUSCULAR | Status: AC
  Administered 2017-11-20: 250 mg via INTRAMUSCULAR
  Filled 2017-11-20: qty 250

## 2017-11-20 MED ORDER — AZITHROMYCIN 250 MG PO TABS
1000.0000 mg | ORAL_TABLET | Freq: Once | ORAL | Status: AC
  Administered 2017-11-20: 1000 mg via ORAL
  Filled 2017-11-20: qty 4

## 2017-11-20 NOTE — Discharge Instructions (Addendum)
Positive pregnancy test today. I would recommend starting taking prenatal vitamins. I have placed information for an OB/GYN for you to follow-up with. You may also follow with your PCP who can refer you to a OB/GYN.  You will need to take Flagyl for 7 days to treat bacterial vaginosis and trichomonas. Abstain from sex while taking this antibiotics.

## 2017-11-20 NOTE — ED Notes (Signed)
Pelvic cart at the bedside 

## 2017-11-20 NOTE — ED Provider Notes (Signed)
[facility] EMERGENCY DEPARTMENT Provider Note  CSN: [id number] rrival date & time: 11/20/17  2107   History   Chief Complaint Chief Complaint  Patient presents with  . Vaginal Discharge    HPI Samantha Casey is a 27 y.o. female with a medical history of anemia and asthma who presented to the ED for vaginal discharge x2 weeks. She describes white/pink discharge. LMP in mid 09/2017. Denies fever, vaginal/pelvic pain, bleeding, abdominal pain, N/V, change in bowel or urinary habits. Endorses unprotected sexual intercourse and states that STDs are possible.    Past Medical History:  Diagnosis Date  . Allergy   . Anemia   . Anxiety   . Asthma   . Depression   . Gastroparesis   . Scoliosis   . Substance abuse (HCC)     There are no active problems to display for this patient.   History reviewed. No pertinent surgical history.   OB History   None      Home Medications    Prior to Admission medications   Medication Sig Start Date End Date Taking? Authorizing Provider  Aspirin-Salicylamide-Caffeine (BC HEADACHE PO) Take 1 packet by mouth daily as needed (BREAST PAIN).    [provider]  ferrous sulfate 325 (65 FE) MG tablet Take 1 tablet (325 mg total) daily by mouth. Patient not taking: Reported on 06/07/2017 03/28/17   Ward, Chase Picket, PA-C  HYDROcodone-acetaminophen (NORCO/VICODIN) 5-325 MG tablet Take 1 tablet by mouth every 6 (six) hours as needed for up to 10 doses. 06/07/17   Cristina Gong, PA-C  ibuprofen (ADVIL,MOTRIN) 600 MG tablet Take 1 tablet (600 mg total) by mouth every 6 (six) hours as needed. Patient not taking: Reported on 06/07/2017 10/30/14   Hedges, Tinnie Gens, PA-C  metroNIDAZOLE (FLAGYL) 500 MG tablet Take 1 tablet (500 mg total) by mouth 2 (two) times daily for 7 days. 11/20/17 11/27/17  Bethel Gaglio, Jerrel Ivory I, PA-C  norgestimate-ethinyl estradiol (ORTHO-CYCLEN,SPRINTEC,PREVIFEM) 0.25-35 MG-MCG tablet Take 1 tablet daily by  mouth. Patient not taking: Reported on 06/07/2017 03/28/17   Ward, Chase Picket, PA-C  ondansetron (ZOFRAN ODT) 4 MG disintegrating tablet Take 1 tablet (4 mg total) by mouth every 8 (eight) hours as needed for nausea or vomiting. 06/07/17   Cristina Gong, PA-C    Family History Family History  Problem Relation Age of Onset  . Hyperlipidemia Mother   . Hyperlipidemia Father   . Stroke Father   . Mental illness Sister   . Cancer Paternal Grandfather   . Hyperlipidemia Paternal Grandfather     Social History Social History   Tobacco Use  . Smoking status: Current Every Day Smoker    Packs/day: 0.50    Types: Cigarettes  . Smokeless tobacco: Never Used  Substance Use Topics  . Alcohol use: Yes    Comment: weekly  . Drug use: No     Allergies   Hydrocodone and Milk-related compounds   Review of Systems Review of Systems  Constitutional: Negative for activity change, appetite change, fatigue and fever.  Gastrointestinal: Negative for abdominal pain, nausea and vomiting.  Genitourinary: Positive for menstrual problem and vaginal discharge. Negative for difficulty urinating, dysuria, frequency, pelvic pain, urgency, vaginal bleeding and vaginal pain.  Skin: Negative.   Neurological: Negative.      Physical Exam Updated Vital Signs BP (!) 129/93 (BP Location: Left Arm)   Pulse 94   Temp 98.9 F (37.2 C) (Oral)   Resp 18   Ht 5\' 4"  (1.626 m)  Wt 56.9 kg (125 lb 7 oz)   LMP  (LMP Unknown)   SpO2 100%   BMI 21.53 kg/m   Physical Exam  Constitutional: She appears well-developed and well-nourished. No distress.  Cardiovascular: Normal rate, regular rhythm and normal heart sounds.  Pulmonary/Chest: Effort normal and breath sounds normal.  Abdominal: Soft. Bowel sounds are normal. There is no tenderness.  Genitourinary: Pelvic exam was performed with patient prone. There is no rash on the right labia. There is no rash on the left labia. No erythema, tenderness  or bleeding in the vagina. Vaginal discharge found.  Genitourinary Comments: Thick, smooth white discharge seen in vagina and from cervix. Cervical os closed.  Nursing note and vitals reviewed.    ED Treatments / Results  Labs (all labs ordered are listed, but only abnormal results are displayed) Labs Reviewed  WET PREP, GENITAL - Abnormal; Notable for the following components:      Result Value   Clue Cells Wet Prep HPF POC PRESENT (*)    WBC, Wet Prep HPF POC FEW (*)    All other components within normal limits  URINALYSIS, ROUTINE W REFLEX MICROSCOPIC - Abnormal; Notable for the following components:   Specific Gravity, Urine <1.005 (*)    Leukocytes, UA TRACE (*)    All other components within normal limits  PREGNANCY, URINE - Abnormal; Notable for the following components:   Preg Test, Ur POSITIVE (*)    All other components within normal limits  URINALYSIS, MICROSCOPIC (REFLEX) - Abnormal; Notable for the following components:   Bacteria, UA RARE (*)    Trichomonas, UA PRESENT (*)    All other components within normal limits  RPR  HIV ANTIBODY (ROUTINE TESTING)  GC/CHLAMYDIA PROBE AMP ([place]) NOT AT [facility]    EKG None  Radiology No results found.  Procedures Procedures (including critical care time)  Medications Ordered in ED Medications  cefTRIAXone (ROCEPHIN) injection 250 mg (250 mg Intramuscular Given 11/20/17 2137)  azithromycin (ZITHROMAX) tablet 1,000 mg (1,000 mg Oral Given 11/20/17 2138)  lidocaine (PF) (XYLOCAINE) 1 % injection (5 mLs  Given 11/20/17 2137)     Initial Impression / Assessment and Plan / ED Course  Triage vital signs and the nursing notes have been reviewed.  Pertinent labs & imaging results that were available during care of the patient were reviewed and considered in medical decision making (see chart for details).  Patient presents afebrile and is well appearing. Physical exam significant for white discharge in vagina, but remaining  exam is normal which is reassuring. Pregnancy test is positive, but clinical presentation is not consistent with a ob/gyn emergency like ectopic pregnancy or spontaneous abortion.     Clinical Course as of Nov 21 2247  Tue Nov 20, 2017  2235 Patient received Rocephin and Zithromax to prophylactically treat chlamydia and gonorrhea. Wet prep and UA showed trichomonas and clue cells. Will treat patient with Flagyl for BV and trichomonas. Pregnancy test is positive.   [GM]    Clinical Course User Index [GM] Angeli Demilio, Sharyon MedicusGabrielle I, PA-C   Final Clinical Impressions(s) / ED Diagnoses  1. Trichomonas. Flagyl 500mg  BID x7 days prescribed. 2. Bacterial Vaginosis. Flagyl 500mg  BID x7 days prescribed. 3. Positive Pregnancy Test. Education provided. Advised patient to follow-up with OB/GYN and begin prenatal vitamins.  Dispo: Home. After thorough clinical evaluation, this patient is determined to be medically stable and can be safely discharged with the previously mentioned treatment and/or outpatient follow-up/referral(s). At this time, there are no  other apparent medical conditions that require further screening, evaluation or treatment.  Final diagnoses:  BV (bacterial vaginosis)  Trichomonas infection  Pregnancy test positive    ED Discharge Orders        Ordered    metroNIDAZOLE (FLAGYL) 500 MG tablet  2 times daily     11/20/17 2245         Rutger Salton, Curran I, PA-C 11/20/17 2252    Melene Plan, DO 11/20/17 2353

## 2017-11-20 NOTE — ED Triage Notes (Signed)
C/o vaginal d/c x 2 weeks-NAD-steady gait 

## 2017-11-21 LAB — GC/CHLAMYDIA PROBE AMP (~~LOC~~) NOT AT ARMC
Chlamydia: NEGATIVE
NEISSERIA GONORRHEA: NEGATIVE

## 2017-11-22 LAB — HIV ANTIBODY (ROUTINE TESTING W REFLEX): HIV Screen 4th Generation wRfx: NONREACTIVE

## 2017-11-22 LAB — RPR: RPR Ser Ql: NONREACTIVE

## 2017-12-07 LAB — OB RESULTS CONSOLE GC/CHLAMYDIA
Chlamydia: NEGATIVE
GC PROBE AMP, GENITAL: NEGATIVE
Gonorrhea: NEGATIVE

## 2017-12-18 DIAGNOSIS — O3680X0 Pregnancy with inconclusive fetal viability, not applicable or unspecified: Secondary | ICD-10-CM | POA: Diagnosis not present

## 2017-12-18 DIAGNOSIS — Z124 Encounter for screening for malignant neoplasm of cervix: Secondary | ICD-10-CM | POA: Diagnosis not present

## 2017-12-18 DIAGNOSIS — Z3A08 8 weeks gestation of pregnancy: Secondary | ICD-10-CM | POA: Diagnosis not present

## 2017-12-18 DIAGNOSIS — Z01419 Encounter for gynecological examination (general) (routine) without abnormal findings: Secondary | ICD-10-CM | POA: Diagnosis not present

## 2017-12-18 DIAGNOSIS — Z363 Encounter for antenatal screening for malformations: Secondary | ICD-10-CM | POA: Diagnosis not present

## 2017-12-18 DIAGNOSIS — Z3689 Encounter for other specified antenatal screening: Secondary | ICD-10-CM | POA: Diagnosis not present

## 2017-12-18 DIAGNOSIS — Z3682 Encounter for antenatal screening for nuchal translucency: Secondary | ICD-10-CM | POA: Diagnosis not present

## 2017-12-18 DIAGNOSIS — Z113 Encounter for screening for infections with a predominantly sexual mode of transmission: Secondary | ICD-10-CM | POA: Diagnosis not present

## 2017-12-18 LAB — OB RESULTS CONSOLE PLATELET COUNT: PLATELETS: 286

## 2017-12-18 LAB — OB RESULTS CONSOLE HGB/HCT, BLOOD
HCT: 39
HEMOGLOBIN: 12.9

## 2017-12-18 LAB — SICKLE CELL SCREEN: SICKLE CELL SCREEN: NEGATIVE

## 2017-12-18 LAB — OB RESULTS CONSOLE RUBELLA ANTIBODY, IGM: Rubella: NON-IMMUNE/NOT IMMUNE

## 2017-12-18 LAB — OB RESULTS CONSOLE HEPATITIS B SURFACE ANTIGEN: HEP B S AG: NEGATIVE

## 2017-12-18 LAB — OB RESULTS CONSOLE HIV ANTIBODY (ROUTINE TESTING): HIV: NONREACTIVE

## 2017-12-18 LAB — OB RESULTS CONSOLE RPR: RPR: NONREACTIVE

## 2017-12-19 DIAGNOSIS — O99320 Drug use complicating pregnancy, unspecified trimester: Secondary | ICD-10-CM | POA: Diagnosis present

## 2018-01-16 DIAGNOSIS — Z3682 Encounter for antenatal screening for nuchal translucency: Secondary | ICD-10-CM | POA: Diagnosis not present

## 2018-01-16 DIAGNOSIS — Z3A12 12 weeks gestation of pregnancy: Secondary | ICD-10-CM | POA: Diagnosis not present

## 2018-01-16 DIAGNOSIS — O26891 Other specified pregnancy related conditions, first trimester: Secondary | ICD-10-CM | POA: Diagnosis not present

## 2018-01-16 DIAGNOSIS — Z3689 Encounter for other specified antenatal screening: Secondary | ICD-10-CM | POA: Diagnosis not present

## 2018-01-19 ENCOUNTER — Other Ambulatory Visit: Payer: Self-pay

## 2018-01-19 ENCOUNTER — Encounter (HOSPITAL_BASED_OUTPATIENT_CLINIC_OR_DEPARTMENT_OTHER): Payer: Self-pay | Admitting: *Deleted

## 2018-01-19 ENCOUNTER — Emergency Department (HOSPITAL_BASED_OUTPATIENT_CLINIC_OR_DEPARTMENT_OTHER)
Admission: EM | Admit: 2018-01-19 | Discharge: 2018-01-19 | Disposition: A | Discharge status: Home / Self Care | Payer: BLUE CROSS/BLUE SHIELD | Attending: Emergency Medicine | Admitting: Emergency Medicine

## 2018-01-19 DIAGNOSIS — F17210 Nicotine dependence, cigarettes, uncomplicated: Secondary | ICD-10-CM | POA: Diagnosis not present

## 2018-01-19 DIAGNOSIS — J45909 Unspecified asthma, uncomplicated: Secondary | ICD-10-CM | POA: Diagnosis not present

## 2018-01-19 DIAGNOSIS — O99332 Smoking (tobacco) complicating pregnancy, second trimester: Secondary | ICD-10-CM | POA: Diagnosis not present

## 2018-01-19 DIAGNOSIS — Z79899 Other long term (current) drug therapy: Secondary | ICD-10-CM | POA: Diagnosis not present

## 2018-01-19 DIAGNOSIS — R070 Pain in throat: Secondary | ICD-10-CM | POA: Insufficient documentation

## 2018-01-19 DIAGNOSIS — R1030 Lower abdominal pain, unspecified: Secondary | ICD-10-CM | POA: Diagnosis not present

## 2018-01-19 DIAGNOSIS — R0981 Nasal congestion: Secondary | ICD-10-CM | POA: Diagnosis not present

## 2018-01-19 DIAGNOSIS — Z3A13 13 weeks gestation of pregnancy: Secondary | ICD-10-CM | POA: Diagnosis not present

## 2018-01-19 DIAGNOSIS — O9989 Other specified diseases and conditions complicating pregnancy, childbirth and the puerperium: Secondary | ICD-10-CM | POA: Diagnosis not present

## 2018-01-19 DIAGNOSIS — O99511 Diseases of the respiratory system complicating pregnancy, first trimester: Secondary | ICD-10-CM | POA: Diagnosis not present

## 2018-01-19 DIAGNOSIS — R05 Cough: Secondary | ICD-10-CM | POA: Diagnosis not present

## 2018-01-19 DIAGNOSIS — J069 Acute upper respiratory infection, unspecified: Secondary | ICD-10-CM | POA: Diagnosis not present

## 2018-01-19 MED ORDER — LORATADINE 10 MG PO TABS: 10.0000 mg | ORAL_TABLET | Freq: Every day | ORAL | 0 refills | Status: DC | PRN

## 2018-01-19 MED ORDER — ALBUTEROL SULFATE HFA 108 (90 BASE) MCG/ACT IN AERS
1.0000 | INHALATION_SPRAY | RESPIRATORY_TRACT | Status: DC | PRN
  Administered 2018-01-19: 1 via RESPIRATORY_TRACT
  Filled 2018-01-19: qty 6.7

## 2018-01-19 NOTE — ED Triage Notes (Signed)
Pt reports cough x 4 days. Last night she began having sharp pains, right lower abdomen. Pt is [redacted] weeks pregnant

## 2018-01-19 NOTE — ED Notes (Signed)
Pt discharged to home NAD.  

## 2018-01-19 NOTE — Discharge Instructions (Signed)
Take albuterol inhaler every 4 hours as needed for wheezing.  Take Claritin for your allergies daily.  You can take Tylenol every 6 hours as needed for right lower quadrant pain.  Return to the ER if you have any new or concerning symptoms like trouble catching her breath, fever, worsening cough, worsening abdominal pain, vomiting that does not stop, vaginal discharge or bleeding.

## 2018-01-19 NOTE — ED Provider Notes (Signed)
[facility] EMERGENCY DEPARTMENT Provider Note   CSN: [id number] Arrival date & time: 01/19/18  1544     History   Chief Complaint Chief Complaint  Patient presents with  . Cough    pregnant  . Abdominal Pain    HPI Samantha Casey is a 27 y.o. female.  HPI   Samantha Casey is a 27yo female who is [redacted] weeks pregnant has a history of asthma, allergies and depression who presents to the emergency department for evaluation of cough, congestion, sore throat, wheezing and right lower abdominal pain.  Patient reports that her allergies are to flaring up about 3 days ago.  She endorses rhinorrhea, congestion and mild sore throat.  States that yesterday she developed a dry cough with occasional clear mucus production.  She endorses a wheezing and intermittent shortness of breath.  Has not used albuterol inhaler given she was concerned that this medication is not safe during pregnancy.  States that her symptoms are similar to previous asthma exacerbations.  Denies chest pain, leg swelling, palpitations.  She reports that she has right lower abdominal pain with coughing.  She denies fevers, chills, headache, neck pain, vomiting, vaginal bleeding, vaginal discharge, diarrhea, dysuria, urinary frequency, hematuria, lightheadedness or syncope.  No prior abdominal surgeries.  Past Medical History:  Diagnosis Date  . Allergy   . Anemia   . Anxiety   . Asthma   . Depression   . Gastroparesis   . Scoliosis   . Substance abuse (HCC)     There are no active problems to display for this patient.   History reviewed. No pertinent surgical history.   OB History    Gravida  1   Para      Term      Preterm      AB      Living        SAB      TAB      Ectopic      Multiple      Live Births               Home Medications    Prior to Admission medications   Medication Sig Start Date End Date Taking? Authorizing Provider  Prenatal Vit-Fe Fumarate-FA (PRENATAL  MULTIVITAMIN) TABS tablet Take 1 tablet by mouth daily at 12 noon.   Yes [provider]  Aspirin-Salicylamide-Caffeine (BC HEADACHE PO) Take 1 packet by mouth daily as needed (BREAST PAIN).    [provider]  ferrous sulfate 325 (65 FE) MG tablet Take 1 tablet (325 mg total) daily by mouth. Patient not taking: Reported on 06/07/2017 03/28/17   Ward, Chase Picket, PA-C  HYDROcodone-acetaminophen (NORCO/VICODIN) 5-325 MG tablet Take 1 tablet by mouth every 6 (six) hours as needed for up to 10 doses. 06/07/17   Cristina Gong, PA-C  ibuprofen (ADVIL,MOTRIN) 600 MG tablet Take 1 tablet (600 mg total) by mouth every 6 (six) hours as needed. Patient not taking: Reported on 06/07/2017 10/30/14   Hedges, Tinnie Gens, PA-C  norgestimate-ethinyl estradiol (ORTHO-CYCLEN,SPRINTEC,PREVIFEM) 0.25-35 MG-MCG tablet Take 1 tablet daily by mouth. Patient not taking: Reported on 06/07/2017 03/28/17   Ward, Chase Picket, PA-C  ondansetron (ZOFRAN ODT) 4 MG disintegrating tablet Take 1 tablet (4 mg total) by mouth every 8 (eight) hours as needed for nausea or vomiting. 06/07/17   Cristina Gong, PA-C    Family History Family History  Problem Relation Age of Onset  . Hyperlipidemia Mother   . Hyperlipidemia  Father   . Stroke Father   . Mental illness Sister   . Cancer Paternal Grandfather   . Hyperlipidemia Paternal Grandfather     Social History Social History   Tobacco Use  . Smoking status: Current Every Day Smoker    Packs/day: 0.50    Types: Cigarettes  . Smokeless tobacco: Never Used  . Tobacco comment: not currently  Substance Use Topics  . Alcohol use: Not Currently    Comment: weekly  . Drug use: Not Currently     Allergies   Hydrocodone and Milk-related compounds   Review of Systems Review of Systems  Constitutional: Negative for chills and fever.  HENT: Positive for congestion, postnasal drip, rhinorrhea and sore throat. Negative for ear pain, facial swelling  and trouble swallowing.   Eyes: Negative for visual disturbance.  Respiratory: Positive for cough, chest tightness, shortness of breath and wheezing.   Cardiovascular: Negative for chest pain and leg swelling.  Gastrointestinal: Positive for abdominal pain (right lower abd pain). Negative for blood in stool, constipation, diarrhea, nausea and vomiting.  Genitourinary: Positive for dysuria.  Musculoskeletal: Negative for back pain, gait problem and neck pain.  Skin: Negative for color change.  Neurological: Negative for headaches.  Psychiatric/Behavioral: Negative for agitation.     Physical Exam Updated Vital Signs BP 109/67 (BP Location: Right Arm)   Pulse 71   Temp 98.3 F (36.8 C) (Oral)   Resp 16   Ht 5\' 5"  (1.651 m)   Wt 62.6 kg   LMP  (LMP Unknown)   SpO2 100%   BMI 22.96 kg/m   Physical Exam  Constitutional: She is oriented to person, place, and time. She appears well-developed and well-nourished. No distress.  No acute distress, nontoxic-appearing.  HENT:  Head: Normocephalic and atraumatic.  Mucous members moist.  Posterior oropharynx mildly erythematous.  No tonsillar exudate or edema.  Uvula midline.  Clear rhinorrhea in bilateral nares.  No facial swelling or tenderness over the maxillary frontal sinuses.  Eyes: Pupils are equal, round, and reactive to light. Conjunctivae are normal. Right eye exhibits no discharge. Left eye exhibits no discharge.  Neck: Normal range of motion. Neck supple.  Cardiovascular: Normal rate, regular rhythm and intact distal pulses.  No murmur heard. Pulmonary/Chest: Effort normal. No respiratory distress.  No respiratory distress.  Speaking full sentences.  Pulse ox 100% on room air.  Dry cough present.  Lungs clear to auscultation, no wheezing, rhonchi or stridor.  Abdominal:  Abdomen soft.  Mildly tender to palpation in the right lower quadrant, which is worsened with abdominal flexion.  No guarding, rebound or rigidity.  No CVA  tenderness.  Musculoskeletal:  No leg swelling or calf tenderness.  Lymphadenopathy:    She has no cervical adenopathy.  Neurological: She is alert and oriented to person, place, and time. Coordination normal.  Skin: Skin is warm and dry. She is not diaphoretic.  Psychiatric: She has a normal mood and affect. Her behavior is normal.  Nursing note and vitals reviewed.   ED Treatments / Results  Labs (all labs ordered are listed, but only abnormal results are displayed) Labs Reviewed - No data to display  EKG None  Radiology No results found.  Procedures Procedures (including critical care time) Fetal heart tones 135bpm with bedside doppler.   Medications Ordered in ED Medications - No data to display   Initial Impression / Assessment and Plan / ED Course  I have reviewed the triage vital signs and the nursing notes.  Pertinent labs & imaging results that were available during my care of the patient were reviewed by me and considered in my medical decision making (see chart for details).    Patient's symptoms consistent with viral URI with cough.  No fever or productive sputum.  Lungs clear to auscultation, doubt pneumonia and will hold off on chest x-ray given symptoms just started yesterday.  She has no hypoxia, tachycardia, leg swelling or chest pain and I do not suspect PE.  She does report intermittent wheezing at home, will refill her albuterol inhaler.  She can take Claritin.  Fetal heart tones with Doppler at bedside 135 bpm.  Her right lower quadrant pain is likely muscular given it is worsened with abdominal flexion and coughing.  I have counseled her that she can take Tylenol every 6 hours as needed at home for this pain.  I discussed strict return precautions.  She agrees and voiced understanding to the above plan and appears reliable.  I discussed this patient with Dr. Erma Heritage who agrees with plan and discharge home.  Final Clinical Impressions(s) / ED Diagnoses    Final diagnoses:  URI with cough and congestion  [redacted] weeks gestation of pregnancy    ED Discharge Orders         Ordered    loratadine (CLARITIN) 10 MG tablet  Daily PRN     01/19/18 1721           Kellie Shropshire, PA-C 01/19/18 Tonna Boehringer, MD 01/20/18 1157

## 2018-01-24 ENCOUNTER — Telehealth: Payer: Self-pay | Admitting: General Practice

## 2018-01-24 ENCOUNTER — Encounter (HOSPITAL_COMMUNITY): Payer: Self-pay | Admitting: *Deleted

## 2018-01-24 ENCOUNTER — Other Ambulatory Visit: Payer: Self-pay

## 2018-01-24 ENCOUNTER — Inpatient Hospital Stay (HOSPITAL_COMMUNITY): Payer: BLUE CROSS/BLUE SHIELD

## 2018-01-24 ENCOUNTER — Inpatient Hospital Stay (HOSPITAL_COMMUNITY)
Admission: AD | Admit: 2018-01-24 | Discharge: 2018-01-24 | Disposition: A | Discharge status: Home / Self Care | Payer: BLUE CROSS/BLUE SHIELD | Source: Ambulatory Visit | Attending: Obstetrics and Gynecology | Admitting: Obstetrics and Gynecology

## 2018-01-24 DIAGNOSIS — O26892 Other specified pregnancy related conditions, second trimester: Secondary | ICD-10-CM

## 2018-01-24 DIAGNOSIS — Z87891 Personal history of nicotine dependence: Secondary | ICD-10-CM | POA: Diagnosis not present

## 2018-01-24 DIAGNOSIS — Z818 Family history of other mental and behavioral disorders: Secondary | ICD-10-CM | POA: Insufficient documentation

## 2018-01-24 DIAGNOSIS — R1030 Lower abdominal pain, unspecified: Secondary | ICD-10-CM | POA: Insufficient documentation

## 2018-01-24 DIAGNOSIS — Z3A14 14 weeks gestation of pregnancy: Secondary | ICD-10-CM | POA: Diagnosis not present

## 2018-01-24 DIAGNOSIS — R109 Unspecified abdominal pain: Secondary | ICD-10-CM | POA: Diagnosis not present

## 2018-01-24 DIAGNOSIS — Z823 Family history of stroke: Secondary | ICD-10-CM | POA: Insufficient documentation

## 2018-01-24 DIAGNOSIS — Z3482 Encounter for supervision of other normal pregnancy, second trimester: Secondary | ICD-10-CM

## 2018-01-24 DIAGNOSIS — R350 Frequency of micturition: Secondary | ICD-10-CM | POA: Diagnosis not present

## 2018-01-24 DIAGNOSIS — Z885 Allergy status to narcotic agent status: Secondary | ICD-10-CM | POA: Insufficient documentation

## 2018-01-24 DIAGNOSIS — O26891 Other specified pregnancy related conditions, first trimester: Secondary | ICD-10-CM | POA: Diagnosis not present

## 2018-01-24 DIAGNOSIS — Z8619 Personal history of other infectious and parasitic diseases: Secondary | ICD-10-CM | POA: Diagnosis not present

## 2018-01-24 DIAGNOSIS — A599 Trichomoniasis, unspecified: Secondary | ICD-10-CM

## 2018-01-24 DIAGNOSIS — Z3A13 13 weeks gestation of pregnancy: Secondary | ICD-10-CM | POA: Diagnosis not present

## 2018-01-24 DIAGNOSIS — O209 Hemorrhage in early pregnancy, unspecified: Secondary | ICD-10-CM | POA: Diagnosis not present

## 2018-01-24 LAB — HCG, QUANTITATIVE, PREGNANCY: hCG, Beta Chain, Quant, S: 105891 m[IU]/mL — ABNORMAL HIGH (ref <5)

## 2018-01-24 LAB — TYPE AND SCREEN
ABO/RH(D): O POS
Antibody Screen: NEGATIVE

## 2018-01-24 LAB — CBC
HEMATOCRIT: 34.4 % — AB (ref 36.0–46.0)
HEMOGLOBIN: 11.6 g/dL — AB (ref 12.0–15.0)
MCH: 28.7 pg (ref 26.0–34.0)
MCHC: 33.7 g/dL (ref 30.0–36.0)
MCV: 85.1 fL (ref 78.0–100.0)
Platelets: 242 10*3/uL (ref 150–400)
RBC: 4.04 MIL/uL (ref 3.87–5.11)
RDW: 13.1 % (ref 11.5–15.5)
WBC: 5.6 10*3/uL (ref 4.0–10.5)

## 2018-01-24 LAB — WET PREP, GENITAL
Clue Cells Wet Prep HPF POC: NONE SEEN
SPERM: NONE SEEN
TRICH WET PREP: NONE SEEN
Yeast Wet Prep HPF POC: NONE SEEN

## 2018-01-24 LAB — ABO/RH: ABO/RH(D): O POS

## 2018-01-24 LAB — URINALYSIS, ROUTINE W REFLEX MICROSCOPIC
Bilirubin Urine: NEGATIVE
GLUCOSE, UA: NEGATIVE mg/dL
Hgb urine dipstick: NEGATIVE
Ketones, ur: 80 mg/dL — AB
LEUKOCYTES UA: NEGATIVE
Nitrite: NEGATIVE
PH: 6 (ref 5.0–8.0)
PROTEIN: NEGATIVE mg/dL
SPECIFIC GRAVITY, URINE: 1.023 (ref 1.005–1.030)

## 2018-01-24 MED ORDER — M.V.I. ADULT IV INJ
Freq: Once | INTRAVENOUS | Status: AC
  Administered 2018-01-24: 12:00:00 via INTRAVENOUS
  Filled 2018-01-24: qty 10

## 2018-01-24 NOTE — Discharge Instructions (Signed)

## 2018-01-24 NOTE — MAU Note (Signed)
Presents with c/o lower abdominal cramping that began last night and worsened this morning and spotting that began this morning.

## 2018-01-24 NOTE — Progress Notes (Signed)
Reports abdominal pain resolved

## 2018-01-24 NOTE — MAU Provider Note (Signed)
History     CSN: [id number]  Arrival date and time: 01/24/18 0930   First Provider Initiated Contact with Patient 01/24/18 1008      Chief Complaint  Patient presents with  . Abdominal Pain  . Vaginal Bleeding   HPI  Samantha Casey is a 27 y.o. G2P0010 at [redacted]w[redacted]d who presents to MAU with chief complaint of abdominal cramping and vaginal spotting. Denies heavy vaginal bleeding, fever, falls, or recent illness.  Patient states she had a first trimester ultrasound at her [facility] Provider in [facility] and was told that there were no concerning findings.  Abdominal cramping This is a new problem. Onset last night. Pain is "crampy" 6/10, bilateral low abdomen. Does not radiate. No aggravating or alleviating factors. Attempted management with 500 mg PO Tylenol without success. Patient endorses urinary frequency but no discomfort with voiding.  Vaginal spotting This is a new problem, onset this morning. Most recent episode at 0900 today. Patient light brown spotting with occasional heavier flow. Also reports "cottage cheese" discharge after intercourse Monday. PMH is significant for BV and Trich, completed prescribed medication, unsure of partner treatment.  OB History    Gravida  2   Para      Term      Preterm      AB  1   Living        SAB  1   TAB      Ectopic      Multiple      Live Births              Past Medical History:  Diagnosis Date  . Allergy   . Anemia   . Anxiety   . Asthma   . Depression   . Gastroparesis   . Scoliosis   . Substance abuse [facility])     Past Surgical History:  Procedure Laterality Date  . NO PAST SURGERIES    . UPPER GI ENDOSCOPY      Family History  Problem Relation Age of Onset  . Hyperlipidemia Mother   . Hyperlipidemia Father   . Stroke Father   . Mental illness Sister   . Cancer Paternal Grandfather   . Hyperlipidemia Paternal Grandfather     Social History   Tobacco Use  . Smoking status: Former Smoker   Packs/day: 0.50    Types: Cigarettes    Last attempt to quit: 12/18/2017    Years since quitting: 0.1  . Smokeless tobacco: Never Used  . Tobacco comment: not currently  Substance Use Topics  . Alcohol use: Not Currently    Comment: weekly  . Drug use: Not Currently    Allergies:  Allergies  Allergen Reactions  . Hydrocodone Nausea And Vomiting  . Milk-Related Compounds Other (See Comments)    Intolerance - upset stomach    Medications Prior to Admission  Medication Sig Dispense Refill Last Dose  . Aspirin-Salicylamide-Caffeine (BC HEADACHE PO) Take 1 packet by mouth daily as needed (BREAST PAIN).   Past Week at Unknown time  . ferrous sulfate 325 (65 FE) MG tablet Take 1 tablet (325 mg total) daily by mouth. (Patient not taking: Reported on 06/07/2017) 30 tablet 0 Not Taking at Unknown time  . HYDROcodone-acetaminophen (NORCO/VICODIN) 5-325 MG tablet Take 1 tablet by mouth every 6 (six) hours as needed for up to 10 doses. 10 tablet 0   . ibuprofen (ADVIL,MOTRIN) 600 MG tablet Take 1 tablet (600 mg total) by mouth every 6 (six) hours as needed. (  Patient not taking: Reported on 06/07/2017) 30 tablet 0 Not Taking at Unknown time  . loratadine (CLARITIN) 10 MG tablet Take 1 tablet (10 mg total) by mouth daily as needed for allergies. 30 tablet 0   . norgestimate-ethinyl estradiol (ORTHO-CYCLEN,SPRINTEC,PREVIFEM) 0.25-35 MG-MCG tablet Take 1 tablet daily by mouth. (Patient not taking: Reported on 06/07/2017) 1 Package 11 Not Taking at Unknown time  . ondansetron (ZOFRAN ODT) 4 MG disintegrating tablet Take 1 tablet (4 mg total) by mouth every 8 (eight) hours as needed for nausea or vomiting. 10 tablet 0   . Prenatal Vit-Fe Fumarate-FA (PRENATAL MULTIVITAMIN) TABS tablet Take 1 tablet by mouth daily at 12 noon.       Review of Systems  Respiratory: Negative for shortness of breath.   Gastrointestinal: Positive for abdominal pain.  Genitourinary: Positive for vaginal bleeding. Negative for  difficulty urinating, dyspareunia, dysuria and flank pain.  Musculoskeletal: Negative for back pain.  Neurological: Negative for headaches.  All other systems reviewed and are negative.  Physical Exam   Blood pressure (!) 96/55, pulse 77, temperature 97.8 F (36.6 C), temperature source Oral, resp. rate 20, height 5\' 5"  (1.651 m), weight 59.4 kg, SpO2 100 %.  Physical Exam  Nursing note and vitals reviewed. Constitutional: She is oriented to person, place, and time. She appears well-developed and well-nourished.  Cardiovascular: Normal rate, regular rhythm, normal heart sounds and intact distal pulses.  Respiratory: Effort normal.  GI: Soft. Bowel sounds are normal. She exhibits no distension. There is no tenderness. There is no rebound and no guarding.  Genitourinary: Uterus normal. Cervix exhibits no motion tenderness and no friability. Vaginal discharge found.  Genitourinary Comments: Small clusters of discharge visible on swab collected and SSE  Neurological: She is alert and oriented to person, place, and time. She has normal reflexes.  Skin: Skin is warm and dry.  Psychiatric: She has a normal mood and affect. Her behavior is normal. Judgment and thought content normal.    MAU Course  Procedures  MDM --No concerning signs on physical exam --Previous confirmed IUP  Patient Vitals for the past 24 hrs:  BP Temp Temp src Pulse Resp SpO2 Height Weight  01/24/18 0955 (!) 96/55 97.8 F (36.6 C) Oral 77 20 100 % - -  01/24/18 0948 - - - - - - 5\' 5"  (1.651 m) 59.4 kg    Orders Placed This Encounter  Procedures  . Wet prep, genital  . US OB LESS THAN 14 WEEKS WITH OB TRANSVAGINAL  . Urinalysis, Routine w reflex microscopic  . CBC  . hCG, quantitative, pregnancy  . Type and screen [facility] [place]   Results for orders placed or performed during the hospital encounter of 01/24/18 (from the past 24 hour(s))  CBC     Status: Abnormal   Collection Time: 01/24/18  10:06 AM  Result Value Ref Range   WBC 5.6 4.0 - 10.5 K/uL   RBC 4.04 3.87 - 5.11 MIL/uL   Hemoglobin 11.6 (L) 12.0 - 15.0 g/dL   HCT 96.0 (L) 45.4 - 09.8 %   MCV 85.1 78.0 - 100.0 fL   MCH 28.7 26.0 - 34.0 pg   MCHC 33.7 30.0 - 36.0 g/dL   RDW 11.9 14.7 - 82.9 %   Platelets 242 150 - 400 K/uL  hCG, quantitative, pregnancy     Status: Abnormal   Collection Time: 01/24/18 10:06 AM  Result Value Ref Range   hCG, Beta Chain, Quant, S 105,891 (H) <5 mIU/mL  Type and screen [facility] [place]     Status: None   Collection Time: 01/24/18 10:06 AM  Result Value Ref Range   ABO/RH(D) O POS    Antibody Screen NEG    Sample Expiration      01/27/2018 Performed at [facility], [address] [place], [place] [address]   ABO/Rh     Status: None   Collection Time: 01/24/18 10:06 AM  Result Value Ref Range   ABO/RH(D)      O POS Performed at [facility], [address], [place], [place] [address]   Urinalysis, Routine w reflex microscopic     Status: Abnormal   Collection Time: 01/24/18 10:34 AM  Result Value Ref Range   Color, Urine YELLOW YELLOW   APPearance HAZY (A) CLEAR   Specific Gravity, Urine 1.023 1.005 - 1.030   pH 6.0 5.0 - 8.0   Glucose, UA NEGATIVE NEGATIVE mg/dL   Hgb urine dipstick NEGATIVE NEGATIVE   Bilirubin Urine NEGATIVE NEGATIVE   Ketones, ur 80 (A) NEGATIVE mg/dL   Protein, ur NEGATIVE NEGATIVE mg/dL   Nitrite NEGATIVE NEGATIVE   Leukocytes, UA NEGATIVE NEGATIVE  Wet prep, genital     Status: Abnormal   Collection Time: 01/24/18 10:37 AM  Result Value Ref Range   Yeast Wet Prep HPF POC NONE SEEN NONE SEEN   Trich, Wet Prep NONE SEEN NONE SEEN   Clue Cells Wet Prep HPF POC NONE SEEN NONE SEEN   WBC, Wet Prep HPF POC FEW (A) NONE SEEN   Sperm NONE SEEN    Meds ordered this encounter  Medications  . multivitamins adult (MVI -12) 10 mL in dextrose 5% lactated ringers 1,000 mL infusion   US Ob Comp Less 14 Wks  Result Date:  01/24/2018 CLINICAL DATA:  Abdominal pain and vaginal bleeding. EXAM: OBSTETRIC <14 WK ULTRASOUND TECHNIQUE: Transabdominal ultrasound was performed for evaluation of the gestation as well as the maternal uterus and adnexal regions. COMPARISON:  None. FINDINGS: Intrauterine gestational sac: Single Yolk sac:  Not Visualized. Embryo:  Visualized. Cardiac Activity: Visualized. Heart Rate: 152 bpm CRL:   75.8 mm   13 w 4 d                  [place] EDC: 07/28/2018 Subchorionic hemorrhage:  None Maternal uterus/adnexae: Right and left ovaries are not visualized. No free fluid in the pelvis. IMPRESSION: Single live intrauterine gestation.  No subchorionic hemorrhage. Electronically Signed   By: Annia Belt M.D.   On: 01/24/2018 11:35    Assessment and Plan  --27 y.o. G2P0010 at [redacted]w[redacted]d  --SIUP with no concerning signs confirmed by [place] today --S/p vitamin bag for ketones, denies abdominal pain, physical complaints at time of discharge --Discharge home in stable condition  Calvert Cantor, CNM 01/24/2018, 1:53 PM

## 2018-01-24 NOTE — Telephone Encounter (Signed)
Patient called & left message on nurse voicemail stating she was recently seen here and just had a missed call from [place]. Patient is requesting a call back.

## 2018-01-25 LAB — GC/CHLAMYDIA PROBE AMP (~~LOC~~) NOT AT ARMC
Chlamydia: NEGATIVE
Neisseria Gonorrhea: NEGATIVE

## 2018-01-29 NOTE — Telephone Encounter (Signed)
Called patient to inform her of test results. No answer patient has a new ob coming up on 01/31/2018

## 2018-01-31 ENCOUNTER — Ambulatory Visit (INDEPENDENT_AMBULATORY_CARE_PROVIDER_SITE_OTHER): Payer: BLUE CROSS/BLUE SHIELD | Admitting: Advanced Practice Midwife

## 2018-01-31 ENCOUNTER — Encounter: Payer: Self-pay | Admitting: Advanced Practice Midwife

## 2018-01-31 VITALS — BP 102/68 | HR 72 | Wt 136.6 lb

## 2018-01-31 DIAGNOSIS — Z3480 Encounter for supervision of other normal pregnancy, unspecified trimester: Secondary | ICD-10-CM | POA: Insufficient documentation

## 2018-01-31 DIAGNOSIS — Z8669 Personal history of other diseases of the nervous system and sense organs: Secondary | ICD-10-CM | POA: Insufficient documentation

## 2018-01-31 DIAGNOSIS — Z3482 Encounter for supervision of other normal pregnancy, second trimester: Secondary | ICD-10-CM

## 2018-01-31 LAB — POCT URINALYSIS DIP (DEVICE)
Bilirubin Urine: NEGATIVE
Glucose, UA: NEGATIVE mg/dL
Hgb urine dipstick: NEGATIVE
Ketones, ur: NEGATIVE mg/dL
LEUKOCYTES UA: NEGATIVE
NITRITE: NEGATIVE
PROTEIN: NEGATIVE mg/dL
SPECIFIC GRAVITY, URINE: 1.01 (ref 1.005–1.030)
UROBILINOGEN UA: 0.2 mg/dL (ref 0.0–1.0)
pH: 7 (ref 5.0–8.0)

## 2018-01-31 NOTE — Patient Instructions (Signed)
Second Trimester of Pregnancy The second trimester is from week 13 through week 28, month 4 through 6. This is often the time in pregnancy that you feel your best. Often times, morning sickness has lessened or quit. You may have more energy, and you may get hungry more often. Your unborn baby (fetus) is growing rapidly. At the end of the sixth month, he or she is about 9 inches long and weighs about 1 pounds. You will likely feel the baby move (quickening) between 18 and 20 weeks of pregnancy.  Research childbirth classes and hospital preregistration at [email or web address]  Follow these instructions at home:  Avoid all smoking, herbs, and alcohol. Avoid drugs not approved by your doctor.  Do not use any tobacco products, including cigarettes, chewing tobacco, and electronic cigarettes. If you need help quitting, ask your doctor. You may get counseling or other support to help you quit.  Only take medicine as told by your doctor. Some medicines are safe and some are not during pregnancy.  Exercise only as told by your doctor. Stop exercising if you start having cramps.  Eat regular, healthy meals.  Wear a good support bra if your breasts are tender.  Do not use hot tubs, steam rooms, or saunas.  Wear your seat belt when driving.  Avoid raw meat, uncooked cheese, and liter boxes and soil used by cats.  Take your prenatal vitamins.  Take 1500-2000 milligrams of calcium daily starting at the 20th week of pregnancy until you deliver your baby.  Try taking medicine that helps you poop (stool softener) as needed, and if your doctor approves. Eat more fiber by eating fresh fruit, vegetables, and whole grains. Drink enough fluids to keep your pee (urine) clear or pale yellow.  Take warm water baths (sitz baths) to soothe pain or discomfort caused by hemorrhoids. Use hemorrhoid cream if your doctor approves.  If you have puffy, bulging veins (varicose veins), wear support hose. Raise  (elevate) your feet for 15 minutes, 3-4 times a day. Limit salt in your diet.  Avoid heavy lifting, wear low heals, and sit up straight.  Rest with your legs raised if you have leg cramps or low back pain.  Visit your dentist if you have not gone during your pregnancy. Use a soft toothbrush to brush your teeth. Be gentle when you floss.  You can have sex (intercourse) unless your doctor tells you not to.  Go to your doctor visits.  Get help if:  You feel dizzy.  You have mild cramps or pressure in your lower belly (abdomen).  You have a nagging pain in your belly area.  You continue to feel sick to your stomach (nauseous), throw up (vomit), or have watery poop (diarrhea).  You have bad smelling fluid coming from your vagina.  You have pain with peeing (urination). Get help right away if:  You have a fever.  You are leaking fluid from your vagina.  You have spotting or bleeding from your vagina.  You have severe belly cramping or pain.  You lose or gain weight rapidly.  You have trouble catching your breath and have chest pain.  You notice sudden or extreme puffiness (swelling) of your face, hands, ankles, feet, or legs.  You have not felt the baby move in over an hour.  You have severe headaches that do not go away with medicine.  You have vision changes. This information is not intended to replace advice given to you by your health care provider. Make   sure you discuss any questions you have with your health care provider. Document Released: 08/02/2009 Document Revised: 10/14/2015 Document Reviewed: 07/09/2012 Elsevier Interactive Patient Education  2017 Elsevier Inc.    

## 2018-01-31 NOTE — BH Specialist Note (Deleted)
Integrated Behavioral Health Initial Visit  MRN: [id number] Name: Samantha Casey  Number of Integrated Behavioral Health Clinician visits:: 1/6 Session Start time: ***  Session End time: *** Total time: {IBH Total Time:21014050}  Type of Service: Integrated Behavioral Health- Individual/Family Interpretor:No. Interpretor Name and Language: n/a   Warm Hand Off Completed.       SUBJECTIVE: Samantha Casey is a 27 y.o. female accompanied by {CHL AMB ACCOMPANIED GE:[id number]}BY:[phone]} Patient was referred by Clayton BiblesSamantha Weinhold, CNM for Initial OB introduction to integrated behavioral health services . Patient reports the following symptoms/concerns: *** Duration of problem: ***; Severity of problem: {Mild/Moderate/Severe:20260}  OBJECTIVE: Mood: {BHH MOOD:22306} and Affect: {BHH AFFECT:22307} Risk of harm to self or others: {CHL AMB BH Suicide Current Mental Status:21022748}  LIFE CONTEXT: Family and Social: *** School/Work: *** Self-Care: *** Life Changes: Current pregnancy ***  GOALS ADDRESSED: Patient will: 1. Reduce symptoms of: {IBH Symptoms:21014056} 2. Increase knowledge and/or ability of: {IBH Patient Tools:21014057}  3. Demonstrate ability to: {IBH Goals:21014053}  INTERVENTIONS: Interventions utilized: {IBH Interventions:21014054}  Standardized Assessments completed: GAD-7 and PHQ 9  ASSESSMENT: Patient currently experiencing Supervision of *** pregnancy, ***.   Patient may benefit from Initial OB introduction to integrated behavioral health services .  PLAN: 1. Follow up with behavioral health clinician on : *** 2. Behavioral recommendations: *** 3. Referral(s): {IBH Referrals:21014055} 4. "From scale of 1-10, how likely are you to follow plan?": ***  Valetta CloseJamie C Deondra Wigger, LCSW  ***

## 2018-01-31 NOTE — Progress Notes (Signed)
Subjective:   Samantha Casey is a 27 y.o. G2P0010 at 381w6d by early ultrasound being seen today for her first obstetrical visit.  Her obstetrical history unremarkable. Patient does intend to breast feed. Pregnancy history fully reviewed. She previously initiated care at [facility] and is being seen today to transfer care,  Patient reports nausea and vomiting and occasional leg cramping. Nausea and vomiting is resolving with diet modification.  HISTORY: OB History  Gravida Para Term Preterm AB Living  2 0 0 0 1 0  SAB TAB Ectopic Multiple Live Births  1 0 0 0 0    # Outcome Date GA Lbr Len/2nd Weight Sex Delivery Anes PTL Lv  2 Current           1 SAB             Last pap smear was done end of July and was normal. Patient reports that July 2018 Pap was a one year follow up after a previous abnormal Pap but she is unsure of the diagnosis for her abnormal Pap.  Past Medical History:  Diagnosis Date  . Allergy   . Anemia   . Anxiety   . Asthma   . Depression   . Gastroparesis   . Scoliosis   . Substance abuse [facility](HCC)    Past Surgical History:  Procedure Laterality Date  . NO PAST SURGERIES    . UPPER GI ENDOSCOPY     Family History  Problem Relation Age of Onset  . Hyperlipidemia Mother   . Hyperlipidemia Father   . Stroke Father   . Mental illness Sister   . Cancer Paternal Grandfather   . Hyperlipidemia Paternal Grandfather    Social History   Tobacco Use  . Smoking status: Former Smoker    Packs/day: 0.50    Types: Cigarettes    Last attempt to quit: 12/18/2017    Years since quitting: 0.1  . Smokeless tobacco: Never Used  . Tobacco comment: not currently  Substance Use Topics  . Alcohol use: Not Currently    Comment: weekly  . Drug use: Not Currently   Allergies  Allergen Reactions  . Hydrocodone Nausea And Vomiting  . Milk-Related Compounds Other (See Comments)    Intolerance - upset stomach   Current Outpatient Medications on File Prior  to Visit  Medication Sig Dispense Refill  . albuterol (ACCUNEB) 1.25 MG/3ML nebulizer solution Take 1 ampule by nebulization every 6 (six) hours as needed for wheezing.    . Prenatal Vit-Fe Fumarate-FA (PRENATAL MULTIVITAMIN) TABS tablet Take 1 tablet by mouth daily at 12 noon.    . ferrous sulfate 325 (65 FE) MG tablet Take 1 tablet (325 mg total) daily by mouth. (Patient not taking: Reported on 06/07/2017) 30 tablet 0  . ondansetron (ZOFRAN ODT) 4 MG disintegrating tablet Take 1 tablet (4 mg total) by mouth every 8 (eight) hours as needed for nausea or vomiting. (Patient not taking: Reported on 01/31/2018) 10 tablet 0   No current facility-administered medications on file prior to visit.     Review of Systems Pertinent items noted in HPI and remainder of comprehensive ROS otherwise negative.  Exam   Vitals:   01/31/18 1013  BP: 102/68  Pulse: 72  Weight: 136 lb 9.6 oz (62 kg)   Fetal Heart Rate (bpm): 150  Uterus:     Pelvic Exam: Perineum: no hemorrhoids, normal perineum   Vulva: normal external genitalia, no lesions   Vagina:  normal  mucosa, normal discharge   Cervix: no lesions and normal, pap smear done.    Adnexa: normal adnexa and no mass, fullness, tenderness   Bony Pelvis: average  System: General: well-developed, well-nourished female in no acute distress   Breast:  normal appearance, no masses or tenderness   Skin: normal coloration and turgor, no rashes   Neurologic: oriented, normal, negative, normal mood   Extremities: normal strength, tone, and muscle mass, ROM of all joints is normal   HEENT PERRLA, extraocular movement intact and sclera clear, anicteric   Mouth/Teeth mucous membranes moist, pharynx normal without lesions and dental hygiene good   Neck supple and no masses   Cardiovascular: regular rate and rhythm   Respiratory:  no respiratory distress, normal breath sounds   Abdomen: soft, non-tender; bowel sounds normal; no masses,  no organomegaly       Assessment:   Pregnancy: G2P0010 Patient Active Problem List   Diagnosis Date Noted  . Supervision of other normal pregnancy, antepartum 01/31/2018  . Hx of migraines 01/31/2018  . Trichomonas infection 01/24/2018     Plan:  1. Encounter for supervision of other normal pregnancy in second trimester - No complaints or concerns, follow routine care - Cervicovaginal not collected due to recent workup in MAU - Records request signed at end of visit   2. Supervision of other normal pregnancy, antepartum - Hemoglobin A1c - CHL AMB BABYSCRIPTS OPT IN - Culture, OB Urine - Hemoglobinopathy Evaluation - Obstetric Panel, Including HIV  - [place] MFM OB COMP + 14 WK; Future  3. Hx of migraines --Cleared by neurology "four or five years ago" --Not currently managing with medication   Initial labs drawn. Continue prenatal vitamins. Genetic Screening discussed, First trimester screen, Quad screen and NIPS: declined. Ultrasound discussed; fetal anatomic survey: ordered. Problem list reviewed and updated. The nature of [facility] with multiple MDs and other Advanced Practice Providers was explained to patient; also emphasized that residents, students are part of our team. Routine obstetric precautions reviewed. Return in about 4 weeks (around 02/28/2018).  Clayton Bibles, CNM 01/31/18 10:54 AM

## 2018-02-02 LAB — CULTURE, OB URINE

## 2018-02-02 LAB — URINE CULTURE, OB REFLEX: Organism ID, Bacteria: NO GROWTH

## 2018-02-04 LAB — OBSTETRIC PANEL, INCLUDING HIV
Antibody Screen: NEGATIVE
Basophils Absolute: 0 10*3/uL (ref 0.0–0.2)
Basos: 0 %
EOS (ABSOLUTE): 0.1 10*3/uL (ref 0.0–0.4)
EOS: 2 %
HEMATOCRIT: 34.7 % (ref 34.0–46.6)
HEMOGLOBIN: 11.5 g/dL (ref 11.1–15.9)
HEP B S AG: NEGATIVE
HIV SCREEN 4TH GENERATION: NONREACTIVE
Immature Grans (Abs): 0 10*3/uL (ref 0.0–0.1)
Immature Granulocytes: 1 %
LYMPHS ABS: 1.4 10*3/uL (ref 0.7–3.1)
Lymphs: 23 %
MCH: 28.3 pg (ref 26.6–33.0)
MCHC: 33.1 g/dL (ref 31.5–35.7)
MCV: 86 fL (ref 79–97)
MONOCYTES: 10 %
Monocytes Absolute: 0.6 10*3/uL (ref 0.1–0.9)
NEUTROS ABS: 3.9 10*3/uL (ref 1.4–7.0)
Neutrophils: 64 %
Platelets: 294 10*3/uL (ref 150–450)
RBC: 4.06 x10E6/uL (ref 3.77–5.28)
RDW: 12.6 % (ref 12.3–15.4)
RH TYPE: POSITIVE
RPR: NONREACTIVE
RUBELLA: 3.91 {index} (ref >0.99)
WBC: 6.1 10*3/uL (ref 3.4–10.8)

## 2018-02-04 LAB — HEMOGLOBIN A1C
Est. average glucose Bld gHb Est-mCnc: 105 mg/dL
Hgb A1c MFr Bld: 5.3 % (ref 4.8–5.6)

## 2018-02-04 LAB — HEMOGLOBINOPATHY EVALUATION
Ferritin: 42 ng/mL (ref 15–150)
HGB A: 97.6 % (ref 96.4–98.8)
HGB SOLUBILITY: NEGATIVE
Hgb A2 Quant: 2.4 % (ref 1.8–3.2)
Hgb C: 0 %
Hgb F Quant: 0 % (ref 0.0–2.0)
Hgb S: 0 %
Hgb Variant: 0 %

## 2018-02-08 ENCOUNTER — Encounter: Payer: Self-pay | Admitting: *Deleted

## 2018-02-27 ENCOUNTER — Ambulatory Visit (HOSPITAL_COMMUNITY)
Admission: RE | Admit: 2018-02-27 | Discharge: 2018-02-27 | Disposition: A | Discharge status: Home / Self Care | Payer: BLUE CROSS/BLUE SHIELD | Source: Ambulatory Visit | Attending: Advanced Practice Midwife | Admitting: Advanced Practice Midwife

## 2018-02-27 DIAGNOSIS — Z3480 Encounter for supervision of other normal pregnancy, unspecified trimester: Secondary | ICD-10-CM

## 2018-02-27 DIAGNOSIS — Z363 Encounter for antenatal screening for malformations: Secondary | ICD-10-CM | POA: Diagnosis not present

## 2018-02-27 DIAGNOSIS — Z3A18 18 weeks gestation of pregnancy: Secondary | ICD-10-CM | POA: Diagnosis not present

## 2018-02-28 ENCOUNTER — Ambulatory Visit (INDEPENDENT_AMBULATORY_CARE_PROVIDER_SITE_OTHER): Payer: BLUE CROSS/BLUE SHIELD | Admitting: Obstetrics and Gynecology

## 2018-02-28 ENCOUNTER — Ambulatory Visit (INDEPENDENT_AMBULATORY_CARE_PROVIDER_SITE_OTHER): Payer: BLUE CROSS/BLUE SHIELD | Admitting: Clinical

## 2018-02-28 DIAGNOSIS — F4323 Adjustment disorder with mixed anxiety and depressed mood: Secondary | ICD-10-CM | POA: Diagnosis not present

## 2018-02-28 DIAGNOSIS — O99312 Alcohol use complicating pregnancy, second trimester: Secondary | ICD-10-CM

## 2018-02-28 DIAGNOSIS — Z3482 Encounter for supervision of other normal pregnancy, second trimester: Secondary | ICD-10-CM

## 2018-02-28 DIAGNOSIS — Z3480 Encounter for supervision of other normal pregnancy, unspecified trimester: Secondary | ICD-10-CM

## 2018-02-28 NOTE — BH Specialist Note (Signed)
Integrated Behavioral Health Initial Visit  MRN: [id number] Name: Samantha Casey  Number of Integrated Behavioral Health Clinician visits:: 1/6 Session Start time: 11:15  Session End time: 11:56 Total time: 40 minutes  Type of Service: Integrated Behavioral Health- Individual/Family Interpretor:No. Interpretor Name and Language: n/a   Warm Hand Off Completed.       SUBJECTIVE: Samantha Casey is a 27 y.o. female accompanied by n/a Patient was referred by Venia Carbon, NP for symptoms of depression and anxiety. Patient reports the following symptoms/concerns: Pt states her primary concern is an increase in feeling depressed and anxious after breakup with FOB. Pt coped immediately afterwards with "too much alcohol for me"(3 drinks, one time in pregnancy), and started smoking cigarettes; prefers to use non-medicinal methods of coping, has used meditation in the past, and is open to learning additional self-coping strategies today. Pt self-reports diagnosis of Borderline personality disorder. Duration of problem: Increase in past month ; Severity of problem: moderate  OBJECTIVE: Mood: Anxious and Depressed and Affect: Appropriate Risk of harm to self or others: No plan to harm self or others  LIFE CONTEXT: Family and Social: Pt currently lives with her mother; plans to move on her own soon, after mother moves to [place] School/Work: Working full-time at [employer] Self-Care: Recognizing a greater need for self-care at this time; has coped with stress via alcohol, cigarettes, and meditation Life Changes: Current pregnancy; recent breakup of romantic relationship with FOB  GOALS ADDRESSED: Patient will: 1. Reduce symptoms of: anxiety and depression 2. Increase knowledge and/or ability of: healthy habits and self-management skills  3. Demonstrate ability to: Increase healthy adjustment to current life circumstances and Decrease self-medicating behaviors  INTERVENTIONS: Interventions  utilized: Mindfulness or Management consultant and Psychoeducation and/or Health Education  Standardized Assessments completed: GAD-7 and PHQ 9  ASSESSMENT: Patient currently experiencing Adjustment disorder with mixed depression and anxiety.   Patient may benefit from psychoeducation and brief therapeutic interventions regarding coping with symptoms of depression and anxiety .  PLAN: 1. Follow up with behavioral health clinician on : One month 2. Behavioral recommendations:  -CALM relaxation breathing exercise twice daily (morning; at bedtime with sleep sounds) -Use meditation app on work breaks, daily as needed -Consider painting again, after self-research regarding safe oil paints to use in pregnancy; discuss with medical provider at next visit -Read educational materials regarding coping with symptoms of depression and anxiety -Use [email or web address] for additional self-education, as requested  -Read educational materials regarding coping with symptoms of depression and anxiety  3. Referral(s): Integrated Behavioral Health Services (In Clinic) 4. "From scale of 1-10, how likely are you to follow plan?": 9  Rae Lips, LCSW  Depression screen [facility] 2/9 02/28/2018 01/31/2018 09/17/2014  Decreased Interest 2 0 0  Down, Depressed, Hopeless 2 0 0  PHQ - 2 Score 4 0 0  Altered sleeping 3 2 -  Tired, decreased energy 3 1 -  Change in appetite 2 1 -  Feeling bad or failure about yourself  1 0 -  Trouble concentrating 1 1 -  Moving slowly or fidgety/restless 0 0 -  Suicidal thoughts 0 0 -  PHQ-9 Score 14 5 -   GAD 7 : Generalized Anxiety Score 02/28/2018 01/31/2018  Nervous, Anxious, on Edge 2 2  Control/stop worrying 3 1  Worry too much - different things 2 1  Trouble relaxing 2 1  Restless 2 1  Easily annoyed or irritable 2 2  Afraid - awful might happen 2 1  Total GAD  7 Score 15 9

## 2018-02-28 NOTE — Progress Notes (Signed)
   PRENATAL VISIT NOTE  Subjective:  Samantha Casey is a 27 y.o. G2P0010 at 102w6d being seen today for ongoing prenatal care.  She is currently monitored for the following issues for this low-risk pregnancy and has Trichomonas infection; Supervision of other normal pregnancy, antepartum; Hx of migraines; and Alcohol use affecting pregnancy in second trimester on their problem list.  Patient reports Stress due recent breakup FOB.  Contractions: Not present. Vag. Bleeding: None.  Movement: Absent. Denies leaking of fluid.   The following portions of the patient's history were reviewed and updated as appropriate: allergies, current medications, past family history, past medical history, past social history, past surgical history and problem list. Problem list updated.  Objective:   Vitals:   02/28/18 1043  BP: 105/69  Pulse: 71  Weight: 141 lb (64 kg)    Fetal Status: Fetal Heart Rate (bpm): 148   Movement: Absent     General:  Alert, oriented and cooperative. Patient is in no acute distress.  Skin: Skin is warm and dry. No rash noted.   Cardiovascular: Normal heart rate noted  Respiratory: Normal respiratory effort, no problems with respiration noted  Abdomen: Soft, gravid, appropriate for gestational age.  Pain/Pressure: Present     Pelvic: Cervical exam deferred        Extremities: Normal range of motion.     Mental Status: Normal mood and affect. Normal behavior. Normal judgment and thought content.   Assessment and Plan:  Pregnancy: G2P0010 at [redacted]w[redacted]d  1. Supervision of other normal pregnancy, antepartum - Ultrasound normal - has a cold today, declines the flu shot.   2. Alcohol use affecting pregnancy in second trimester  - Seeing Asher Muir today - admits to 1 glass or wine per week, sometimes more.  - Uses alcohol to cope with recent breakup    There are no diagnoses linked to this encounter. Preterm labor symptoms and general obstetric precautions including but not  limited to vaginal bleeding, contractions, leaking of fluid and fetal movement were reviewed in detail with the patient. Please refer to After Visit Summary for other counseling recommendations.  Return in about 4 weeks (around 03/28/2018).  No future appointments.  Venia Carbon, NP

## 2018-03-01 ENCOUNTER — Other Ambulatory Visit (HOSPITAL_COMMUNITY): Payer: Self-pay

## 2018-03-06 ENCOUNTER — Telehealth: Payer: Self-pay | Admitting: General Practice

## 2018-03-06 DIAGNOSIS — Z889 Allergy status to unspecified drugs, medicaments and biological substances status: Secondary | ICD-10-CM

## 2018-03-06 MED ORDER — LORATADINE 10 MG PO TABS: 10.0000 mg | ORAL_TABLET | Freq: Every day | ORAL | 2 refills | Status: DC

## 2018-03-06 NOTE — Telephone Encounter (Signed)
Patient called and left message on nurse voicemail line stating she needs a prescription for claritin resubmitted to her pharmacy because she lost the first one. Rx obtained via Nolene Bernheim. Called & informed patient. Patient verbalized understanding & had no questions.

## 2018-03-28 ENCOUNTER — Ambulatory Visit (INDEPENDENT_AMBULATORY_CARE_PROVIDER_SITE_OTHER): Payer: BLUE CROSS/BLUE SHIELD | Admitting: Internal Medicine

## 2018-03-28 VITALS — BP 111/65 | HR 85 | Wt 148.8 lb

## 2018-03-28 DIAGNOSIS — Z3480 Encounter for supervision of other normal pregnancy, unspecified trimester: Secondary | ICD-10-CM

## 2018-03-28 DIAGNOSIS — Z3482 Encounter for supervision of other normal pregnancy, second trimester: Secondary | ICD-10-CM

## 2018-03-28 DIAGNOSIS — O99312 Alcohol use complicating pregnancy, second trimester: Secondary | ICD-10-CM

## 2018-03-28 NOTE — Progress Notes (Signed)
   PRENATAL VISIT NOTE  Subjective:  Samantha Casey is a 27 y.o. G2P0010 at [redacted]w[redacted]d being seen today for ongoing prenatal care.  She is currently monitored for the following issues for this low-risk pregnancy and has Trichomonas infection; Supervision of other normal pregnancy, antepartum; Hx of migraines; and Alcohol use affecting pregnancy in second trimester on their problem list.  Patient reports no complaints.  Contractions: Not present. Vag. Bleeding: None.  Movement: Present. Denies leaking of fluid.   The following portions of the patient's history were reviewed and updated as appropriate: allergies, current medications, past family history, past medical history, past social history, past surgical history and problem list. Problem list updated.  Objective:   Vitals:   03/28/18 1111  BP: 111/65  Pulse: 85  Weight: 148 lb 12.8 oz (67.5 kg)    Fetal Status: Fetal Heart Rate (bpm): 154   Movement: Present     General:  Alert, oriented and cooperative. Patient is in no acute distress.  Skin: Skin is warm and dry. No rash noted.   Cardiovascular: Normal heart rate noted  Respiratory: Normal respiratory effort, no problems with respiration noted  Abdomen: Soft, gravid, appropriate for gestational age.  Pain/Pressure: Present     Pelvic: Cervical exam deferred        Extremities: Normal range of motion.     Mental Status: Normal mood and affect. Normal behavior. Normal judgment and thought content.   Assessment and Plan:  Pregnancy: G2P0010 at [redacted]w[redacted]d  1. Supervision of other normal pregnancy, antepartum Continue routine PNC.   2. Alcohol use affecting pregnancy in second trimester Patient to follow up with Asher Muir.   Preterm labor symptoms and general obstetric precautions including but not limited to vaginal bleeding, contractions, leaking of fluid and fetal movement were reviewed in detail with the patient. Please refer to After Visit Summary for other counseling  recommendations.  Return in about 4 weeks (around 04/25/2018) for routine PNC.  Future Appointments  Date Time Provider Department Center  04/25/2018  9:35 AM Rasch, Harolyn Rutherford, NP [facility]    De Hollingshead, DO

## 2018-03-28 NOTE — Patient Instructions (Signed)
I would recommend that you follow up with Samantha Casey and schedule an appointment with her.    Second Trimester of Pregnancy The second trimester is from week 13 through week 28, month 4 through 6. This is often the time in pregnancy that you feel your best. Often times, morning sickness has lessened or quit. You may have more energy, and you may get hungry more often. Your unborn baby (fetus) is growing rapidly. At the end of the sixth month, he or she is about 9 inches long and weighs about 1 pounds. You will likely feel the baby move (quickening) between 18 and 20 weeks of pregnancy. Follow these instructions at home:  Avoid all smoking, herbs, and alcohol. Avoid drugs not approved by your doctor.  Do not use any tobacco products, including cigarettes, chewing tobacco, and electronic cigarettes. If you need help quitting, ask your doctor. You may get counseling or other support to help you quit.  Only take medicine as told by your doctor. Some medicines are safe and some are not during pregnancy.  Exercise only as told by your doctor. Stop exercising if you start having cramps.  Eat regular, healthy meals.  Wear a good support bra if your breasts are tender.  Do not use hot tubs, steam rooms, or saunas.  Wear your seat belt when driving.  Avoid raw meat, uncooked cheese, and liter boxes and soil used by cats.  Take your prenatal vitamins.  Take 1500-2000 milligrams of calcium daily starting at the 20th week of pregnancy until you deliver your baby.  Try taking medicine that helps you poop (stool softener) as needed, and if your doctor approves. Eat more fiber by eating fresh fruit, vegetables, and whole grains. Drink enough fluids to keep your pee (urine) clear or pale yellow.  Take warm water baths (sitz baths) to soothe pain or discomfort caused by hemorrhoids. Use hemorrhoid cream if your doctor approves.  If you have puffy, bulging veins (varicose veins), wear support hose. Raise  (elevate) your feet for 15 minutes, 3-4 times a day. Limit salt in your diet.  Avoid heavy lifting, wear low heals, and sit up straight.  Rest with your legs raised if you have leg cramps or low back pain.  Visit your dentist if you have not gone during your pregnancy. Use a soft toothbrush to brush your teeth. Be gentle when you floss.  You can have sex (intercourse) unless your doctor tells you not to.  Go to your doctor visits. Get help if:  You feel dizzy.  You have mild cramps or pressure in your lower belly (abdomen).  You have a nagging pain in your belly area.  You continue to feel sick to your stomach (nauseous), throw up (vomit), or have watery poop (diarrhea).  You have bad smelling fluid coming from your vagina.  You have pain with peeing (urination). Get help right away if:  You have a fever.  You are leaking fluid from your vagina.  You have spotting or bleeding from your vagina.  You have severe belly cramping or pain.  You lose or gain weight rapidly.  You have trouble catching your breath and have chest pain.  You notice sudden or extreme puffiness (swelling) of your face, hands, ankles, feet, or legs.  You have not felt the baby move in over an hour.  You have severe headaches that do not go away with medicine.  You have vision changes. This information is not intended to replace advice given to  you by your health care provider. Make sure you discuss any questions you have with your health care provider. Document Released: 08/02/2009 Document Revised: 10/14/2015 Document Reviewed: 07/09/2012 Elsevier Interactive Patient Education  2017 [employer].

## 2018-04-17 ENCOUNTER — Telehealth: Payer: Self-pay

## 2018-04-17 NOTE — Telephone Encounter (Signed)
Pt called wanting to know if her intermittent leave papers are completed.  Attempted to call pt unable to LM due message stating "try your call again later, the person your trying to reach is not available." MyChart message sent.

## 2018-04-22 NOTE — Progress Notes (Signed)
Received a call from pt's dentistry office, [facility], and was informed that the pt was going to need a tooth extracted and the Dentist needed confimration if it would be okay to administer Triazolam 0.25 mg tablet one time dose to the pt.  Per Thressa ShellerHeather Hogan, CNM pt was permitted to have requested medication.  Letter also faxed to dentist permitted dentist office to prescribe the medication.

## 2018-04-25 ENCOUNTER — Ambulatory Visit (INDEPENDENT_AMBULATORY_CARE_PROVIDER_SITE_OTHER): Payer: BLUE CROSS/BLUE SHIELD | Admitting: Obstetrics and Gynecology

## 2018-04-25 VITALS — BP 106/77 | HR 82 | Wt 150.6 lb

## 2018-04-25 DIAGNOSIS — Z3480 Encounter for supervision of other normal pregnancy, unspecified trimester: Secondary | ICD-10-CM | POA: Diagnosis not present

## 2018-04-25 DIAGNOSIS — Z23 Encounter for immunization: Secondary | ICD-10-CM

## 2018-04-25 DIAGNOSIS — Z3482 Encounter for supervision of other normal pregnancy, second trimester: Secondary | ICD-10-CM

## 2018-04-25 DIAGNOSIS — O99312 Alcohol use complicating pregnancy, second trimester: Secondary | ICD-10-CM

## 2018-04-25 DIAGNOSIS — K08409 Partial loss of teeth, unspecified cause, unspecified class: Secondary | ICD-10-CM

## 2018-04-25 DIAGNOSIS — Z029 Encounter for administrative examinations, unspecified: Secondary | ICD-10-CM

## 2018-04-25 MED ORDER — IBUPROFEN 600 MG PO TABS: 600.0000 mg | ORAL_TABLET | Freq: Four times a day (QID) | ORAL | 0 refills | Status: DC | PRN

## 2018-04-25 MED ORDER — HYDROCODONE-ACETAMINOPHEN 5-325 MG PO TABS: 1.0000 | ORAL_TABLET | ORAL | 0 refills | Status: DC | PRN

## 2018-04-25 NOTE — Progress Notes (Signed)
   PRENATAL VISIT NOTE  Subjective:  Samantha Casey is a 27 y.o. G2P0010 at 5515w6d being seen today for ongoing prenatal care.  She is currently monitored for the following issues for this low-risk pregnancy and has Trichomonas infection; Supervision of other normal pregnancy, antepartum; Hx of migraines; Alcohol use affecting pregnancy in second trimester; and History of tooth extraction on their problem list.  Patient reports Tooth pain.  Contractions: Not present. Vag. Bleeding: None.  Movement: Present. Denies leaking of fluid.   The following portions of the patient's history were reviewed and updated as appropriate: allergies, current medications, past family history, past medical history, past social history, past surgical history and problem list. Problem list updated.  Objective:   Vitals:   04/25/18 0943  BP: 106/77  Pulse: 82  Weight: 150 lb 9.6 oz (68.3 kg)    Fetal Status: Fetal Heart Rate (bpm): 142   Movement: Present     General:  Alert, oriented and cooperative. Patient is in no acute distress.  Skin: Skin is warm and dry. No rash noted. Extraction of right lower wisdom tooth. No erythema or odor. Mild swelling   Cardiovascular: Normal heart rate noted  Respiratory: Normal respiratory effort, no problems with respiration noted  Abdomen: Soft, gravid, appropriate for gestational age.  Pain/Pressure: Absent     Pelvic: Cervical exam deferred        Extremities: Normal range of motion.  Edema: None  Mental Status: Normal mood and affect. Normal behavior. Normal judgment and thought content.   Assessment and Plan:  Pregnancy: G2P0010 at 4515w6d  1. Supervision of other normal pregnancy, antepartum  - Tdap vaccine greater than or equal to 7yo IM - Cystic Fibrosis Mutation 97  2. History of tooth extraction, unspecified edentulism class  - Call dentist back for f/u. May have dry sockets. Syringe given today for flushing - short course of ibuprofen and Vicodin for  pain.   3. Alcohol use affecting pregnancy in second trimester  - doing better with coping. Not using.   There are no diagnoses linked to this encounter. Preterm labor symptoms and general obstetric precautions including but not limited to vaginal bleeding, contractions, leaking of fluid and fetal movement were reviewed in detail with the patient. Please refer to After Visit Summary for other counseling recommendations.  Return in about 4 weeks (around 05/23/2018) for For 2 hour GTT.  Future Appointments  Date Time Provider Department Center  05/20/2018 11:15 AM Armando ReichertHogan, Heather D[facility]    Venia CarbonJennifer Berk Pilot, NP

## 2018-05-03 LAB — CYSTIC FIBROSIS MUTATION 97: GENE DIS ANAL CARRIER INTERP BLD/T-IMP: NOT DETECTED

## 2018-05-14 DIAGNOSIS — B372 Candidiasis of skin and nail: Secondary | ICD-10-CM | POA: Diagnosis not present

## 2018-05-20 ENCOUNTER — Encounter: Payer: Self-pay | Admitting: Advanced Practice Midwife

## 2018-05-22 NOTE — L&D Delivery Note (Addendum)
Patient: Samantha Casey MRN: [id number]  GBS status: negative, IAP given none  Patient is a 28 y.o. now G2P1011 s/p NSVD at [redacted]w[redacted]d, who was admitted for SOL. SROM 5h 8m prior to delivery with moderate-thick meconium fluid.   Delivery Note At 10:03 PM a viable and healthy female was delivered via Vaginal, Vacuum (Extractor) (Presentation: LOA; vertex ).  APGAR: 9, 9; weight  pending.   Placenta status: spontaneous, intact.  3 vessel cord. With no complications.   Anesthesia: Epidural, Fentanyl, 1% Lidocaine Episiotomy: None Lacerations:  1st degree perineal  Suture Repair: 3.0 vicryl Est. Blood Loss (mL):  349   After receiving the epidural, baby had a prolonged bradycardic episode (60-80's w/occ return to baseline for about 10 minutes).  Pt received phenyephrine, position changes, 02, IVF bolus.  She was also noted to rapidly progress to C/C/+2.  Samantha Casey called to room an opted for a VAD>  Verbal consent: obtained from patient for VAVD.    Kiwi vacuum applied at +2 station. No pop offs. Head delivered LOA. One nuchal cord present, easily reduced. Shoulder and body delivered in usual fashion. Infant with spontaneous cry, placed on mother's abdomen, dried and bulb suctioned. Cord clamped x 2 after 1-minute delay, and cut by family member. Cord blood drawn. Placenta delivered spontaneously with gentle cord traction. Fundus firm with massage and Pitocin. Perineum inspected and found to have 1st degree perineal laceration, which was was repaired with 3-0 vicryl with good hemostasis achieved.  Mom to postpartum.  Baby to Couplet care / Skin to Skin.  Samantha Casey 07/25/2018, 10:33 PM   After delivery, 3rd stage management was turned over to myself and Samantha Casey. The above was performed under my direct supervision and guidance.

## 2018-05-23 ENCOUNTER — Other Ambulatory Visit: Payer: Self-pay | Admitting: *Deleted

## 2018-05-23 ENCOUNTER — Ambulatory Visit (INDEPENDENT_AMBULATORY_CARE_PROVIDER_SITE_OTHER): Payer: BLUE CROSS/BLUE SHIELD | Admitting: Obstetrics and Gynecology

## 2018-05-23 VITALS — BP 111/75 | HR 93 | Wt 157.2 lb

## 2018-05-23 DIAGNOSIS — Z3483 Encounter for supervision of other normal pregnancy, third trimester: Secondary | ICD-10-CM

## 2018-05-23 DIAGNOSIS — Z3480 Encounter for supervision of other normal pregnancy, unspecified trimester: Secondary | ICD-10-CM

## 2018-05-23 DIAGNOSIS — O99313 Alcohol use complicating pregnancy, third trimester: Secondary | ICD-10-CM

## 2018-05-23 DIAGNOSIS — O99312 Alcohol use complicating pregnancy, second trimester: Secondary | ICD-10-CM

## 2018-05-23 NOTE — Progress Notes (Signed)
   PRENATAL VISIT NOTE  Subjective:  Samantha Casey is a 28 y.o. G2P0010 at [redacted]w[redacted]d being seen today for ongoing prenatal care.  She is currently monitored for the following issues for this low-risk pregnancy and has Trichomonas infection; Supervision of other normal pregnancy, antepartum; Hx of migraines; Alcohol use affecting pregnancy in second trimester; and History of tooth extraction on their problem list.  Patient reports no complaints.  Contractions: Not present. Vag. Bleeding: None.  Movement: Present. Denies leaking of fluid.   The following portions of the patient's history were reviewed and updated as appropriate: allergies, current medications, past family history, past medical history, past social history, past surgical history and problem list. Problem list updated.  Objective:   Vitals:   05/23/18 1115  BP: 111/75  Pulse: 93  Weight: 157 lb 3.2 oz (71.3 kg)    Fetal Status: Fetal Heart Rate (bpm): 141 Fundal Height: 30 cm Movement: Present     General:  Alert, oriented and cooperative. Patient is in no acute distress.  Skin: Skin is warm and dry. No rash noted.   Cardiovascular: Normal heart rate noted  Respiratory: Normal respiratory effort, no problems with respiration noted  Abdomen: Soft, gravid, appropriate for gestational age.  Pain/Pressure: Present     Pelvic: Cervical exam deferred        Extremities: Normal range of motion.  Edema: None  Mental Status: Normal mood and affect. Normal behavior. Normal judgment and thought content.   Assessment and Plan:  Pregnancy: G2P0010 at [redacted]w[redacted]d  1. Supervision of other normal pregnancy, antepartum  - Doing well.  - Was not able to be added to schedule for GTT. She is coming back Monday for 2 hour GTT and 3rd trimester labs.   2. Alcohol use affecting pregnancy in second trimester  -Not consuming. Better social situation currently.   There are no diagnoses linked to this encounter. Preterm labor symptoms and  general obstetric precautions including but not limited to vaginal bleeding, contractions, leaking of fluid and fetal movement were reviewed in detail with the patient. Please refer to After Visit Summary for other counseling recommendations.  Return in about 11 days (around 06/03/2018) for Come fasting for 2 hour GTT.  Future Appointments  Date Time Provider Department Center  05/27/2018  8:20 AM [facility]  06/06/2018  9:55 AM Zelene Barga, Harolyn Rutherford, NP [facility]    Venia Carbon, NP

## 2018-05-27 ENCOUNTER — Other Ambulatory Visit: Payer: BLUE CROSS/BLUE SHIELD

## 2018-05-27 DIAGNOSIS — Z3480 Encounter for supervision of other normal pregnancy, unspecified trimester: Secondary | ICD-10-CM

## 2018-05-28 LAB — CBC
HEMATOCRIT: 31.8 % — AB (ref 34.0–46.6)
HEMOGLOBIN: 10.6 g/dL — AB (ref 11.1–15.9)
MCH: 28.3 pg (ref 26.6–33.0)
MCHC: 33.3 g/dL (ref 31.5–35.7)
MCV: 85 fL (ref 79–97)
Platelets: 275 10*3/uL (ref 150–450)
RBC: 3.74 x10E6/uL — ABNORMAL LOW (ref 3.77–5.28)
RDW: 11.8 % (ref 11.7–15.4)
WBC: 6 10*3/uL (ref 3.4–10.8)

## 2018-05-28 LAB — HIV ANTIBODY (ROUTINE TESTING W REFLEX): HIV SCREEN 4TH GENERATION: NONREACTIVE

## 2018-05-28 LAB — GLUCOSE TOLERANCE, 2 HOURS W/ 1HR
GLUCOSE, 1 HOUR: 136 mg/dL (ref 65–179)
Glucose, 2 hour: 86 mg/dL (ref 65–152)
Glucose, Fasting: 79 mg/dL (ref 65–91)

## 2018-05-28 LAB — RPR: RPR: NONREACTIVE

## 2018-06-06 ENCOUNTER — Ambulatory Visit (INDEPENDENT_AMBULATORY_CARE_PROVIDER_SITE_OTHER): Payer: Medicaid Other | Admitting: Obstetrics and Gynecology

## 2018-06-06 VITALS — BP 116/75 | HR 111 | Wt 156.0 lb

## 2018-06-06 DIAGNOSIS — Z3483 Encounter for supervision of other normal pregnancy, third trimester: Secondary | ICD-10-CM

## 2018-06-06 DIAGNOSIS — Z3A32 32 weeks gestation of pregnancy: Secondary | ICD-10-CM

## 2018-06-06 DIAGNOSIS — Z8669 Personal history of other diseases of the nervous system and sense organs: Secondary | ICD-10-CM

## 2018-06-06 DIAGNOSIS — Z3480 Encounter for supervision of other normal pregnancy, unspecified trimester: Secondary | ICD-10-CM

## 2018-06-06 NOTE — Progress Notes (Signed)
   PRENATAL VISIT NOTE  Subjective:  Samantha Casey is a 28 y.o. G2P0010 at 7825w6d being seen today for ongoing prenatal care.  She is currently monitored for the following issues for this low-risk pregnancy and has Trichomonas infection; Supervision of other normal pregnancy, antepartum; Hx of migraines; Alcohol use affecting pregnancy in second trimester; and History of tooth extraction on their problem list.  Patient reports no complaints.  Contractions: Not present. Vag. Bleeding: None.  Movement: Present. Denies leaking of fluid.   The following portions of the patient's history were reviewed and updated as appropriate: allergies, current medications, past family history, past medical history, past social history, past surgical history and problem list. Problem list updated.  Objective:   Vitals:   06/06/18 0956  BP: 116/75  Pulse: (!) 111  Weight: 156 lb (70.8 kg)    Fetal Status: Fetal Heart Rate (bpm): 154 Fundal Height: 32 cm Movement: Present     General:  Alert, oriented and cooperative. Patient is in no acute distress.  Skin: Skin is warm and dry. No rash noted.   Cardiovascular: Normal heart rate noted  Respiratory: Normal respiratory effort, no problems with respiration noted  Abdomen: Soft, gravid, appropriate for gestational age.  Pain/Pressure: Present     Pelvic: Cervical exam deferred        Extremities: Normal range of motion.  Edema: None  Mental Status: Normal mood and affect. Normal behavior. Normal judgment and thought content.   Assessment and Plan:  Pregnancy: G2P0010 at 5825w6d  1. Supervision of other normal pregnancy, antepartum  Doing well Interested in Water birth.  Info sheet given. Next visit should be with CNM [facility]   2. Hx of migraines  More frequent headaches. BP good today. No scotoma. She is using peppermint which is helping. She knows to call the office if HA's persist.   There are no diagnoses linked to  this encounter. Preterm labor symptoms and general obstetric precautions including but not limited to vaginal bleeding, contractions, leaking of fluid and fetal movement were reviewed in detail with the patient. Please refer to After Visit Summary for other counseling recommendations.  Return in about 2 weeks (around 06/20/2018) for interested in waterbirth please schedule with CNM/Midwife .  Future Appointments  Date Time Provider Department Center  06/25/2018  9:15 AM Marylene LandKooistra, Kathryn Lorraine, CNM [facility]    Venia CarbonJennifer Tailynn Armetta, NP

## 2018-06-06 NOTE — Patient Instructions (Signed)
Considering Waterbirth? Guide for patients at [facility] [employer]  Why consider waterbirth?  . Gentle birth for babies . Less pain medicine used in labor . May allow for passive descent/less pushing . May reduce perineal tears  . More mobility and instinctive maternal position changes . Increased maternal relaxation . Reduced blood pressure in labor  Is waterbirth safe? What are the risks of infection, drowning or other complications?  . Infection: o Very low risk (3.7 % for tub vs 4.8% for bed) o 7 in 8000 waterbirths with documented infection o Poorly cleaned equipment most common cause o Slightly lower group B strep transmission rate  . Drowning o Maternal:  - Very low risk   - Related to seizures or fainting o Newborn:  - Very low risk. No evidence of increased risk of respiratory problems in multiple large studies - Physiological protection from breathing under water - Avoid underwater birth if there are any fetal complications - Once baby's head is out of the water, keep it out.  . Birth complication o Some reports of cord trauma, but risk decreased by bringing baby to surface gradually o No evidence of increased risk of shoulder dystocia. Mothers can usually change positions faster in water than in a bed, possibly aiding the maneuvers to free the shoulder.   You must attend a Doren Custard class at [facility]  3rd Wednesday of every month from 7-9pm  [employer] by calling [phone] or online at [email or web address]  Bring [place] the certificate from the class to your prenatal appointment  Meet with a midwife at 36 weeks to see if you can still plan a waterbirth and to sign the consent.   If you plan a waterbirth after July 14, 2018, at [facility], you will need to purchase the following:  Fish net  Bathing suit top (optional)  Long-handled mirror (optional)  If you plan a waterbirth before  July 14, 2018: Purchase or rent the following supplies: You are responsible for providing all supplies listed above. **If you do not have all necessary supplies you cannot have a waterbirth.**   Water Birth Pool (Birth Pool in a Box or [place] for instance)  (Tubs start ~$125)  Single-use disposable tub liner designed for your brand of tub  Electric drain pump to remove water (We recommend 792 gallon per hour or greater pump.)   New garden hose labeled "lead-free", "suitable for drinking water",  Separate garden hose to remove the dirty water  Fish net  Bathing suit top (optional)  Long-handled mirror (optional)  Places to purchase or rent supplies:   [employer] for tub purchases and supplies  [employer]com for tub purchases and supplies  [employer] (www.thelaborladies.com) $275 for tub rental/set-up & take down/kit   [employer] (http://www.fleming.com/.htm) Information regarding doulas (labor support) who provide pool rentals  Things that would prevent you from having a waterbirth:  Premature, <37wks  Previous cesarean birth  Presence of thick meconium-stained fluid  Multiple gestation (Twins, triplets, etc.)  Uncontrolled diabetes or gestational diabetes requiring medication  Hypertension requiring medication or diagnosis of pre-eclampsia  Heavy vaginal bleeding  Non-reassuring fetal heart rate  Active infection (MRSA, etc.). Group B Strep is NOT a contraindication for waterbirth.  If your labor has to be induced and induction method requires continuous monitoring of the baby's heart rate  Other risks/issues identified by your obstetrical provider  Please remember that birth is unpredictable. Under certain unforeseeable circumstances your  provider may advise against giving birth in the tub. These decisions will be made on a case-by-case basis and with the safety of you and your baby as our highest  priority.

## 2018-06-25 ENCOUNTER — Ambulatory Visit (INDEPENDENT_AMBULATORY_CARE_PROVIDER_SITE_OTHER): Payer: Medicaid Other | Admitting: Student

## 2018-06-25 ENCOUNTER — Other Ambulatory Visit (HOSPITAL_COMMUNITY)
Admission: RE | Admit: 2018-06-25 | Discharge: 2018-06-25 | Disposition: A | Discharge status: Home / Self Care | Payer: Medicaid Other | Source: Ambulatory Visit | Attending: Student | Admitting: Student

## 2018-06-25 DIAGNOSIS — Z3480 Encounter for supervision of other normal pregnancy, unspecified trimester: Secondary | ICD-10-CM

## 2018-06-25 DIAGNOSIS — Z3483 Encounter for supervision of other normal pregnancy, third trimester: Secondary | ICD-10-CM

## 2018-06-25 LAB — OB RESULTS CONSOLE GC/CHLAMYDIA: Gonorrhea: NEGATIVE

## 2018-06-25 LAB — OB RESULTS CONSOLE GBS: GBS: NEGATIVE

## 2018-06-25 NOTE — Patient Instructions (Signed)
Tests and Screening During Pregnancy Having certain tests and screenings during pregnancy is an important part of your prenatal care. These tests help your health care provider find problems that might affect your pregnancy. Some tests are done for all pregnant women, and some are optional. Most of the tests and screenings do not pose any risks for you or your baby. You may need additional testing if any routine tests indicate a problem. Tests and screenings done in early pregnancy Some tests and screenings you can expect to have in early pregnancy include:  Blood tests, such as: ? Complete blood count (CBC). This test is done to check your red and white blood cells. It can help identify a risk for anemia, infection, or bleeding. ? Blood typing. This test determines your blood type as well as whether you have a certain protein in your red blood cells (Rh factor). If you do not have this protein (Rh negative) and your baby does have it (Rh positive), your body could make antibodies to the Rh factor. This could be dangerous to your baby's health. ? Tests to check for diseases that can cause birth defects or can be passed to your baby, such as:  [place] measles (rubella). The test indicates whether you are immune to rubella.  Hepatitis B and C. All women are tested for hepatitis B. You may also be tested for hepatitis C if you have risk factors for the condition.  Zika virus infection. You may have a blood or urine test to check for this infection if you or your partner has traveled to an area where the virus occurs.  Urine testing. A urine sample can be tested for diabetes, protein in your urine, and signs of infection.  Testing for sexually transmitted infections (STIs), such as HIV, syphilis, and chlamydia.  Testing for tuberculosis. You may have this skin test if you are at risk for tuberculosis.  Fetal ultrasound. This is an imaging study of your developing baby. It is done using sound waves  and a computer. This test may be done at 11-14 weeks to confirm your pregnancy and help determine your due date. Tests and screenings done later in pregnancy Certain tests are done for the first time during later pregnancy. In addition, some of the tests that were done in early pregnancy are repeated at this time. Some common tests you can expect to have later in pregnancy include:  Rh antibody testing. If you are Rh negative, you will have a blood test at about 28 weeks of pregnancy to see if you are producing Rh antibodies. If you have not started to make antibodies, you will be given an injection to prevent you from making antibodies for the rest of your pregnancy.  Glucose screening. This tests your blood sugar to find out whether you are developing the type of diabetes that occurs during pregnancy (gestational diabetes). You may have this screening earlier if you have risk factors for diabetes.  Screening for group B streptococcus (GBS). GBS is a type of bacteria that may live in your rectum or vagina. You may have GBS without any symptoms. GBS can spread to your baby during birth. This test involves doing a rectal and vaginal swab at 35-37 weeks of pregnancy. If testing is positive for GBS, you may be treated with antibiotic medicine.  CBC to check for anemia and blood-clotting ability.  Urine tests to check for protein, which can be a sign of a condition called preeclampsia.  Fetal ultrasound. This  may be repeated at 16-20 weeks to check how your baby is growing and developing. Screening for birth defects Some birth defects are caused by abnormal genes passed down through families. Early in your pregnancy, tests can be done to find out if your baby is at risk for a genetic disorder. This testing is optional. The type of testing recommended for you will depend on your family and medical history, your ethnicity, and your age. Testing may include:  Screening tests. These tests may include an  ultrasound, blood tests, or a combination of both. The blood tests are used to check for abnormal genes, and the ultrasound is done to look for early birth defects.  Carrier screening. This test involves checking the blood or saliva of both parents to see if they carry abnormal genes that could be passed down to a baby. If genetic screening shows that your baby is at risk for a genetic defect, additional diagnostic testing may be recommended, such as:  Amniocentesis. This involves testing a sample of fluid from your womb (amniotic fluid).  Chorionic villus sampling. In this test, a sample of cells from your placenta is checked for abnormal cells. Unlike other tests done during pregnancy, diagnostic testing does have some risk for your pregnancy. Talk to your health care provider about the risks and benefits of genetic testing. Where to find more information  American Pregnancy Association: americanpregnancy.org/prenatal-testing  Office on Women's Health: womenshealth.gov/pregnancy  March of Dimes: marchofdimes.org/pregnancy Questions to ask your health care provider  What routine tests are recommended for me?  When and how will these tests be done?  When will I get the results of routine tests?  What do the results of these tests mean for me or my baby?  Do you recommend any genetic screening tests? Which ones?  Should I see a genetic counselor before having genetic screening? Summary  Having tests and screenings during pregnancy is an important part of your prenatal care.  In early pregnancy, testing may be done to check blood type, Rh status, and risks for various conditions that can affect your baby.  Fetal ultrasound may be done in early pregnancy to confirm a pregnancy and later to look for any birth defects.  Later in pregnancy, tests may include screening for GBS and gestational diabetes.  Genetic testing is optional. Consider talking to a genetic counselor about this  testing. This information is not intended to replace advice given to you by your health care provider. Make sure you discuss any questions you have with your health care provider. Document Released: 07/23/2017 Document Revised: 07/23/2017 Document Reviewed: 07/23/2017 Elsevier Interactive Patient Education  2019 [employer]vier Inc.  

## 2018-06-25 NOTE — Progress Notes (Addendum)
   PRENATAL VISIT NOTE  Subjective:  Samantha Casey is a 28 y.o. G2P0010 at [redacted]w[redacted]d being seen today for ongoing prenatal care.  She is currently monitored for the following issues for this low-risk pregnancy and has Trichomonas infection; Supervision of other normal pregnancy, antepartum; Hx of migraines; Alcohol use affecting pregnancy in second trimester; and History of tooth extraction on their problem list.  Patient reports no complaints.  Contractions: Not present. Vag. Bleeding: None.  Movement: Present. Denies leaking of fluid.   The following portions of the patient's history were reviewed and updated as appropriate: allergies, current medications, past family history, past medical history, past social history, past surgical history and problem list. Problem list updated.  Objective:   Vitals:   06/25/18 0931  BP: 108/74  Pulse: 93  Weight: 163 lb (73.9 kg)    Fetal Status: Fetal Heart Rate (bpm): 151 Fundal Height: 36 cm Movement: Present  Presentation: Vertex  General:  Alert, oriented and cooperative. Patient is in no acute distress.  Skin: Skin is warm and dry. No rash noted.   Cardiovascular: Normal heart rate noted  Respiratory: Normal respiratory effort, no problems with respiration noted  Abdomen: Soft, gravid, appropriate for gestational age.  Pain/Pressure: Present     Pelvic: Cervical exam deferred        Extremities: Normal range of motion.  Edema: None  Mental Status: Normal mood and affect. Normal behavior. Normal judgment and thought content.   Assessment and Plan:  Pregnancy: G2P0010 at [redacted]w[redacted]d  1. Supervision of other normal pregnancy, antepartum -bring certificate from WB class next week; attending class on the 12  -reviewed that the new hospital has its own waterbirth tubs.  -cultures today  Preterm labor symptoms and general obstetric precautions including but not limited to vaginal bleeding, contractions, leaking of fluid and fetal movement were  reviewed in detail with the patient. Please refer to After Visit Summary for other counseling recommendations.  Return in about 1 week (around 07/02/2018), or needs appt after the 12.  No future appointments.  Marylene Land, CNM

## 2018-06-25 NOTE — Addendum Note (Signed)
Addended by: Ernestina Patches on: 06/25/2018 10:00 AM   Modules accepted: Orders

## 2018-06-26 LAB — GC/CHLAMYDIA PROBE AMP (~~LOC~~) NOT AT ARMC
CHLAMYDIA, DNA PROBE: NEGATIVE
NEISSERIA GONORRHEA: NEGATIVE

## 2018-06-28 LAB — CULTURE, BETA STREP (GROUP B ONLY): Strep Gp B Culture: NEGATIVE

## 2018-07-04 ENCOUNTER — Ambulatory Visit (INDEPENDENT_AMBULATORY_CARE_PROVIDER_SITE_OTHER): Payer: Medicaid Other | Admitting: Obstetrics and Gynecology

## 2018-07-04 VITALS — BP 105/65 | HR 112 | Wt 170.0 lb

## 2018-07-04 DIAGNOSIS — Z3480 Encounter for supervision of other normal pregnancy, unspecified trimester: Secondary | ICD-10-CM

## 2018-07-04 DIAGNOSIS — Z3A36 36 weeks gestation of pregnancy: Secondary | ICD-10-CM

## 2018-07-04 DIAGNOSIS — A599 Trichomoniasis, unspecified: Secondary | ICD-10-CM

## 2018-07-04 DIAGNOSIS — Z3483 Encounter for supervision of other normal pregnancy, third trimester: Secondary | ICD-10-CM

## 2018-07-04 NOTE — Patient Instructions (Addendum)
Considering Waterbirth? Guide for patients at [facility]  Why consider waterbirth?  . Gentle birth for babies . Less pain medicine used in labor . May allow for passive descent/less pushing . May reduce perineal tears  . More mobility and instinctive maternal position changes . Increased maternal relaxation . Reduced blood pressure in labor  Is waterbirth safe? What are the risks of infection, drowning or other complications?  . Infection: o Very low risk (3.7 % for tub vs 4.8% for bed) o 7 in 8000 waterbirths with documented infection o Poorly cleaned equipment most common cause o Slightly lower group B strep transmission rate  . Drowning o Maternal:  - Very low risk   - Related to seizures or fainting o Newborn:  - Very low risk. No evidence of increased risk of respiratory problems in multiple large studies - Physiological protection from breathing under water - Avoid underwater birth if there are any fetal complications - Once baby's head is out of the water, keep it out.  . Birth complication o Some reports of cord trauma, but risk decreased by bringing baby to surface gradually o No evidence of increased risk of shoulder dystocia. Mothers can usually change positions faster in water than in a bed, possibly aiding the maneuvers to free the shoulder.   You must attend a Waterbirth class at [facility]  3rd Wednesday of every month from 7-9pm  Free  Register by calling [phone] or online at [email or web address]  Bring us the certificate from the class to your prenatal appointment  Meet with a midwife at 36 weeks to see if you can still plan a waterbirth and to sign the consent.   If you plan a waterbirth after July 14, 2018, at [facility] [place], you will need to purchase the following:  Fish net  Bathing suit top (optional)  Long-handled mirror (optional)  If you plan a waterbirth before  July 14, 2018: Purchase or rent the following supplies: You are responsible for providing all supplies listed above. **If you do not have all necessary supplies you cannot have a waterbirth.**   Water Birth Pool (Birth Pool in a Box or [place] for instance)  (Tubs start ~$125)  Single-use disposable tub liner designed for your brand of tub  Electric drain pump to remove water (We recommend 792 gallon per hour or greater pump.)   New garden hose labeled "lead-free", "suitable for drinking water",  Separate garden hose to remove the dirty water  Fish net  Bathing suit top (optional)  Long-handled mirror (optional)  Places to purchase or rent supplies:   Yourwaterbirth.com for tub purchases and supplies  Waterbirthsolutions.com for tub purchases and supplies  [employer] (www.thelaborladies.com) $275 for tub rental/set-up & take down/kit   [place] [employer] (http://www.padanc.org/MeetUs.htm) Information regarding doulas (labor support) who provide pool rentals  Things that would prevent you from having a waterbirth:  Premature, <37wks  Previous cesarean birth  Presence of thick meconium-stained fluid  Multiple gestation (Twins, triplets, etc.)  Uncontrolled diabetes or gestational diabetes requiring medication  Hypertension requiring medication or diagnosis of pre-eclampsia  Heavy vaginal bleeding  Non-reassuring fetal heart rate  Active infection (MRSA, etc.). Group B Strep is NOT a contraindication for waterbirth.  If your labor has to be induced and induction method requires continuous monitoring of the baby's heart rate  Other risks/issues identified by your obstetrical provider  Please remember that birth is unpredictable. Under certain unforeseeable circumstances your   provider may advise against giving birth in the tub. These decisions will be made on a case-by-case basis and with the safety of you and your baby as our highest  priority.

## 2018-07-04 NOTE — Progress Notes (Signed)
   PRENATAL VISIT NOTE  Subjective:  Samantha Casey is a 28 y.o. G2P0010 at [redacted]w[redacted]d being seen today for ongoing prenatal care.  She is currently monitored for the following issues for this low-risk pregnancy and has Trichomonas infection; Supervision of other normal pregnancy, antepartum; Hx of migraines; Alcohol use affecting pregnancy in second trimester; and History of tooth extraction on their problem list.  Patient reports no complaints.  Contractions: Not present. Vag. Bleeding: None.  Movement: Present. Denies leaking of fluid.   The following portions of the patient's history were reviewed and updated as appropriate: allergies, current medications, past family history, past medical history, past social history, past surgical history and problem list. Problem list updated.  Objective:   Vitals:   07/04/18 1449  BP: 105/65  Pulse: (!) 112  Weight: 170 lb (77.1 kg)    Fetal Status: Fetal Heart Rate (bpm): 147 Fundal Height: 37 cm Movement: Present     General:  Alert, oriented and cooperative. Patient is in no acute distress.  Skin: Skin is warm and dry. No rash noted.   Cardiovascular: Normal heart rate noted  Respiratory: Normal respiratory effort, no problems with respiration noted  Abdomen: Soft, gravid, appropriate for gestational age.  Pain/Pressure: Present     Pelvic: Cervical exam deferred        Extremities: Normal range of motion.  Edema: None  Mental Status: Normal mood and affect. Normal behavior. Normal judgment and thought content.   Assessment and Plan:  Pregnancy: G2P0010 at [redacted]w[redacted]d  1. Trichomonas infection  TOC negative   2. Supervision of other normal pregnancy, antepartum  Doing well Completed water birth class Discussed hospital move.  GBS negative  There are no diagnoses linked to this encounter. Preterm labor symptoms and general obstetric precautions including but not limited to vaginal bleeding, contractions, leaking of fluid and fetal  movement were reviewed in detail with the patient. Please refer to After Visit Summary for other counseling recommendations.  Return in about 1 week (around 07/11/2018).  No future appointments.  Venia Carbon, NP

## 2018-07-12 ENCOUNTER — Encounter: Payer: Self-pay | Admitting: Family Medicine

## 2018-07-16 ENCOUNTER — Ambulatory Visit (INDEPENDENT_AMBULATORY_CARE_PROVIDER_SITE_OTHER): Payer: Medicaid Other | Admitting: Nurse Practitioner

## 2018-07-16 VITALS — BP 116/72 | HR 107 | Wt 175.2 lb

## 2018-07-16 DIAGNOSIS — O26843 Uterine size-date discrepancy, third trimester: Secondary | ICD-10-CM

## 2018-07-16 DIAGNOSIS — Z3A38 38 weeks gestation of pregnancy: Secondary | ICD-10-CM

## 2018-07-16 DIAGNOSIS — Z3480 Encounter for supervision of other normal pregnancy, unspecified trimester: Secondary | ICD-10-CM

## 2018-07-16 DIAGNOSIS — Z3483 Encounter for supervision of other normal pregnancy, third trimester: Secondary | ICD-10-CM

## 2018-07-16 NOTE — Progress Notes (Signed)
    Subjective:  Samantha Casey is a 28 y.o. G2P0010 at [redacted]w[redacted]d being seen today for ongoing prenatal care.  She is currently monitored for the following issues for this low-risk pregnancy and has Trichomonas infection; Supervision of other normal pregnancy, antepartum; Hx of migraines; Alcohol use affecting pregnancy in second trimester; and History of tooth extraction on their problem list.  Patient reports no complaints.  Contractions: Not present. Vag. Bleeding: None.  Movement: Present. Denies leaking of fluid.   The following portions of the patient's history were reviewed and updated as appropriate: allergies, current medications, past family history, past medical history, past social history, past surgical history and problem list. Problem list updated.  Objective:   Vitals:   07/16/18 1441  BP: 116/72  Pulse: (!) 107  Weight: 175 lb 3.2 oz (79.5 kg)    Fetal Status: Fetal Heart Rate (bpm): 136 Fundal Height: 35 cm Movement: Present     General:  Alert, oriented and cooperative. Patient is in no acute distress.  Skin: Skin is warm and dry. No rash noted.   Cardiovascular: Normal heart rate noted  Respiratory: Normal respiratory effort, no problems with respiration noted  Abdomen: Soft, gravid, appropriate for gestational age. Pain/Pressure: Present     Pelvic:  Cervical exam performed Dilation: 1 Effacement (%): 50 Station: -3  Extremities: Normal range of motion.  Edema: None  Mental Status: Normal mood and affect. Normal behavior. Normal judgment and thought content.   Urinalysis:      Assessment and Plan:  Pregnancy: G2P0010 at [redacted]w[redacted]d  1. Supervision of other normal pregnancy, antepartum Size less than dates - will get [place] for interval growth and AFI Encouraged to watch the virtual tour of the new [facility] hospital.  Term labor symptoms and general obstetric precautions including but not limited to vaginal bleeding, contractions, leaking of fluid and fetal movement were  reviewed in detail with the patient. Please refer to After Visit Summary for other counseling recommendations.  Return in about 1 week (around 07/23/2018).  Nolene Bernheim, RN, MSN, NP-BC Nurse Practitioner, [facility] for [employer], [facility] 07/16/2018 3:01 PM

## 2018-07-16 NOTE — Patient Instructions (Addendum)
Signs and Symptoms of Labor  Labor is your body's natural process of moving your baby, placenta, and umbilical cord out of your uterus. The process of labor usually starts when your baby is full-term, between 37 and 40 weeks of pregnancy.  How will I know when I am close to going into labor?  As your body prepares for labor and the birth of your baby, you may notice the following symptoms in the weeks and days before true labor starts:   Having a strong desire to get your home ready to receive your new baby. This is called nesting. Nesting may be a sign that labor is approaching, and it may occur several weeks before birth. Nesting may involve cleaning and organizing your home.   Passing a small amount of thick, bloody mucus out of your vagina (normal bloody show or losing your mucus plug). This may happen more than a week before labor begins, or it might occur right before labor begins as the opening of the cervix starts to widen (dilate). For some women, the entire mucus plug passes at once. For others, smaller portions of the mucus plug may gradually pass over several days.   Your baby moving (dropping) lower in your pelvis to get into position for birth (lightening). When this happens, you may feel more pressure on your bladder and pelvic bone and less pressure on your ribs. This may make it easier to breathe. It may also cause you to need to urinate more often and have problems with bowel movements.   Having "practice contractions" (Braxton Hicks contractions) that occur at irregular (unevenly spaced) intervals that are more than 10 minutes apart. This is also called false labor. False labor contractions are common after exercise or sexual activity, and they will stop if you change position, rest, or drink fluids. These contractions are usually mild and do not get stronger over time. They may feel like:  ? A backache or back pain.  ? Mild cramps, similar to menstrual cramps.  ? Tightening or pressure in  your abdomen.  Other early symptoms that labor may be starting soon include:   Nausea or loss of appetite.   Diarrhea.   Having a sudden burst of energy, or feeling very tired.   Mood changes.   Having trouble sleeping.  How will I know when labor has begun?  Signs that true labor has begun may include:   Having contractions that come at regular (evenly spaced) intervals and increase in intensity. This may feel like more intense tightening or pressure in your abdomen that moves to your back.  ? Contractions may also feel like rhythmic pain in your upper thighs or back that comes and goes at regular intervals.  ? For first-time mothers, this change in intensity of contractions often occurs at a more gradual pace.  ? Women who have given birth before may notice a more rapid progression of contraction changes.   Having a feeling of pressure in the vaginal area.   Your water breaking (rupture of membranes). This is when the sac of fluid that surrounds your baby breaks. When this happens, you will notice fluid leaking from your vagina. This may be clear or blood-tinged. Labor usually starts within 24 hours of your water breaking, but it may take longer to begin.  ? Some women notice this as a gush of fluid.  ? Others notice that their underwear repeatedly becomes damp.  Follow these instructions at home:     When labor   starts, or if your water breaks, call your health care provider or nurse care line. Based on your situation, they will determine when you should go in for an exam.   When you are in early labor, you may be able to rest and manage symptoms at home. Some strategies to try at home include:  ? Breathing and relaxation techniques.  ? Taking a warm bath or shower.  ? Listening to music.  ? Using a heating pad on the lower back for pain. If you are directed to use heat:   Place a towel between your skin and the heat source.   Leave the heat on for 20-30 minutes.   Remove the heat if your skin turns  bright red. This is especially important if you are unable to feel pain, heat, or cold. You may have a greater risk of getting burned.  Get help right away if:   You have painful, regular contractions that are 5 minutes apart or less.   Labor starts before you are [redacted] weeks along in your pregnancy.   You have a fever.   You have a headache that does not go away.   You have bright red blood coming from your vagina.   You do not feel your baby moving.   You have a sudden onset of:  ? Severe headache with vision problems.  ? Nausea, vomiting, or diarrhea.  ? Chest pain or shortness of breath.  These symptoms may be an emergency. If your health care provider recommends that you go to the hospital or birth center where you plan to deliver, do not drive yourself. Have someone else drive you, or call emergency services (911 in the [place]  Summary   Labor is your body's natural process of moving your baby, placenta, and umbilical cord out of your uterus.   The process of labor usually starts when your baby is full-term, between 37 and 40 weeks of pregnancy.   When labor starts, or if your water breaks, call your health care provider or nurse care line. Based on your situation, they will determine when you should go in for an exam.  This information is not intended to replace advice given to you by your health care provider. Make sure you discuss any questions you have with your health care provider.  Document Released: 10/13/2016 Document Revised: 10/13/2016 Document Reviewed: 10/13/2016  Elsevier Interactive Patient Education  2019 [employer]  Fetal Movement Counts  Patient Name: ________________________________________________ Patient Due Date: ____________________  What is a fetal movement count?    A fetal movement count is the number of times that you feel your baby move during a certain amount of time. This may also be called a fetal kick count. A fetal movement count is recommended for every pregnant  woman. You may be asked to start counting fetal movements as early as week 28 of your pregnancy.  Pay attention to when your baby is most active. You may notice your baby's sleep and wake cycles. You may also notice things that make your baby move more. You should do a fetal movement count:   When your baby is normally most active.   At the same time each day.  A good time to count movements is while you are resting, after having something to eat and drink.  How do I count fetal movements?  1. Find a quiet, comfortable area. Sit, or lie down on your side.  2. Write down the date, the start   time and stop time, and the number of movements that you felt between those two times. Take this information with you to your health care visits.  3. For 2 hours, count kicks, flutters, swishes, rolls, and jabs. You should feel at least 10 movements during 2 hours.  4. You may stop counting after you have felt 10 movements.  5. If you do not feel 10 movements in 2 hours, have something to eat and drink. Then, keep resting and counting for 1 hour. If you feel at least 4 movements during that hour, you may stop counting.  Contact a health care provider if:   You feel fewer than 4 movements in 2 hours.   Your baby is not moving like he or she usually does.  Date: ____________ Start time: ____________ Stop time: ____________ Movements: ____________  Date: ____________ Start time: ____________ Stop time: ____________ Movements: ____________  Date: ____________ Start time: ____________ Stop time: ____________ Movements: ____________  Date: ____________ Start time: ____________ Stop time: ____________ Movements: ____________  Date: ____________ Start time: ____________ Stop time: ____________ Movements: ____________  Date: ____________ Start time: ____________ Stop time: ____________ Movements: ____________  Date: ____________ Start time: ____________ Stop time: ____________ Movements: ____________  Date: ____________ Start time:  ____________ Stop time: ____________ Movements: ____________  Date: ____________ Start time: ____________ Stop time: ____________ Movements: ____________  This information is not intended to replace advice given to you by your health care provider. Make sure you discuss any questions you have with your health care provider.  Document Released: 06/07/2006 Document Revised: 01/05/2016 Document Reviewed: 06/17/2015  Elsevier Interactive Patient Education  2019 [employer]

## 2018-07-23 ENCOUNTER — Ambulatory Visit (HOSPITAL_COMMUNITY)
Admission: RE | Admit: 2018-07-23 | Discharge: 2018-07-23 | Disposition: A | Discharge status: Home / Self Care | Payer: Medicaid Other | Source: Ambulatory Visit | Attending: Nurse Practitioner | Admitting: Nurse Practitioner

## 2018-07-23 ENCOUNTER — Ambulatory Visit (HOSPITAL_COMMUNITY): Payer: Medicaid Other

## 2018-07-23 DIAGNOSIS — O26843 Uterine size-date discrepancy, third trimester: Secondary | ICD-10-CM | POA: Diagnosis not present

## 2018-07-23 DIAGNOSIS — Z362 Encounter for other antenatal screening follow-up: Secondary | ICD-10-CM | POA: Diagnosis not present

## 2018-07-23 DIAGNOSIS — Z3A39 39 weeks gestation of pregnancy: Secondary | ICD-10-CM | POA: Diagnosis not present

## 2018-07-23 DIAGNOSIS — Z3480 Encounter for supervision of other normal pregnancy, unspecified trimester: Secondary | ICD-10-CM | POA: Diagnosis not present

## 2018-07-24 ENCOUNTER — Encounter: Payer: Self-pay | Admitting: Obstetrics & Gynecology

## 2018-07-24 ENCOUNTER — Ambulatory Visit (INDEPENDENT_AMBULATORY_CARE_PROVIDER_SITE_OTHER): Payer: Medicaid Other | Admitting: Obstetrics & Gynecology

## 2018-07-24 VITALS — BP 111/74 | HR 82 | Wt 178.0 lb

## 2018-07-24 DIAGNOSIS — Z3A39 39 weeks gestation of pregnancy: Secondary | ICD-10-CM

## 2018-07-24 DIAGNOSIS — Z3480 Encounter for supervision of other normal pregnancy, unspecified trimester: Secondary | ICD-10-CM

## 2018-07-24 DIAGNOSIS — Z3483 Encounter for supervision of other normal pregnancy, third trimester: Secondary | ICD-10-CM

## 2018-07-24 NOTE — Progress Notes (Signed)
   PRENATAL VISIT NOTE  Subjective:  Samantha Casey is a 28 y.o. G2P0010 at [redacted]w[redacted]d being seen today for ongoing prenatal care.  She is currently monitored for the following issues for this low-risk pregnancy and has Trichomonas infection; Supervision of other normal pregnancy, antepartum; Hx of migraines; Alcohol use affecting pregnancy in second trimester; and History of tooth extraction on their problem list.  Patient reports no complaints.  Contractions: Irregular. Vag. Bleeding: None.  Movement: Present. Denies leaking of fluid.   The following portions of the patient's history were reviewed and updated as appropriate: allergies, current medications, past family history, past medical history, past social history, past surgical history and problem list. Problem list updated.  Objective:   Vitals:   07/24/18 0923  BP: 111/74  Pulse: 82  Weight: 178 lb (80.7 kg)    Fetal Status: Fetal Heart Rate (bpm): 134   Movement: Present     General:  Alert, oriented and cooperative. Patient is in no acute distress.  Skin: Skin is warm and dry. No rash noted.   Cardiovascular: Normal heart rate noted  Respiratory: Normal respiratory effort, no problems with respiration noted  Abdomen: Soft, gravid, appropriate for gestational age.  Pain/Pressure: Present     Pelvic: Cervical exam performed        Extremities: Normal range of motion.  Edema: None  Mental Status: Normal mood and affect. Normal behavior. Normal judgment and thought content.   Assessment and Plan:  Pregnancy: G2P0010 at [redacted]w[redacted]d  1. Supervision of other normal pregnancy, antepartum   Term labor symptoms and general obstetric precautions including but not limited to vaginal bleeding, contractions, leaking of fluid and fetal movement were reviewed in detail with the patient. Please refer to After Visit Summary for other counseling recommendations.  No follow-ups on file.  No future appointments.  Allie Bossier, MD

## 2018-07-25 ENCOUNTER — Inpatient Hospital Stay (HOSPITAL_COMMUNITY): Payer: Medicaid Other | Admitting: Anesthesiology

## 2018-07-25 ENCOUNTER — Other Ambulatory Visit: Payer: Self-pay

## 2018-07-25 ENCOUNTER — Encounter (HOSPITAL_COMMUNITY): Payer: Self-pay | Admitting: *Deleted

## 2018-07-25 ENCOUNTER — Inpatient Hospital Stay (HOSPITAL_COMMUNITY)
Admission: AD | Admit: 2018-07-25 | Discharge: 2018-07-27 | DRG: 806 | Disposition: A | Discharge status: Home / Self Care | Payer: Medicaid Other | Attending: Obstetrics & Gynecology | Admitting: Obstetrics & Gynecology

## 2018-07-25 DIAGNOSIS — O99312 Alcohol use complicating pregnancy, second trimester: Secondary | ICD-10-CM

## 2018-07-25 DIAGNOSIS — F17210 Nicotine dependence, cigarettes, uncomplicated: Secondary | ICD-10-CM | POA: Diagnosis present

## 2018-07-25 DIAGNOSIS — Z3480 Encounter for supervision of other normal pregnancy, unspecified trimester: Secondary | ICD-10-CM

## 2018-07-25 DIAGNOSIS — O99344 Other mental disorders complicating childbirth: Secondary | ICD-10-CM | POA: Diagnosis present

## 2018-07-25 DIAGNOSIS — Z3A39 39 weeks gestation of pregnancy: Secondary | ICD-10-CM

## 2018-07-25 DIAGNOSIS — O9952 Diseases of the respiratory system complicating childbirth: Secondary | ICD-10-CM | POA: Diagnosis present

## 2018-07-25 DIAGNOSIS — O26893 Other specified pregnancy related conditions, third trimester: Secondary | ICD-10-CM | POA: Diagnosis present

## 2018-07-25 DIAGNOSIS — O99324 Drug use complicating childbirth: Secondary | ICD-10-CM | POA: Diagnosis present

## 2018-07-25 DIAGNOSIS — F418 Other specified anxiety disorders: Secondary | ICD-10-CM | POA: Diagnosis present

## 2018-07-25 DIAGNOSIS — F1290 Cannabis use, unspecified, uncomplicated: Secondary | ICD-10-CM | POA: Diagnosis present

## 2018-07-25 DIAGNOSIS — O99320 Drug use complicating pregnancy, unspecified trimester: Secondary | ICD-10-CM | POA: Diagnosis present

## 2018-07-25 DIAGNOSIS — J45909 Unspecified asthma, uncomplicated: Secondary | ICD-10-CM | POA: Diagnosis present

## 2018-07-25 DIAGNOSIS — O99334 Smoking (tobacco) complicating childbirth: Secondary | ICD-10-CM | POA: Diagnosis present

## 2018-07-25 HISTORY — DX: Headache: R51

## 2018-07-25 HISTORY — DX: Unspecified convulsions: R56.9

## 2018-07-25 HISTORY — DX: Headache, unspecified: R51.9

## 2018-07-25 LAB — CBC
HCT: 34.8 % — ABNORMAL LOW (ref 36.0–46.0)
Hemoglobin: 11.4 g/dL — ABNORMAL LOW (ref 12.0–15.0)
MCH: 28 pg (ref 26.0–34.0)
MCHC: 32.8 g/dL (ref 30.0–36.0)
MCV: 85.5 fL (ref 80.0–100.0)
PLATELETS: 305 10*3/uL (ref 150–400)
RBC: 4.07 MIL/uL (ref 3.87–5.11)
RDW: 13.1 % (ref 11.5–15.5)
WBC: 8.2 10*3/uL (ref 4.0–10.5)
nRBC: 0 % (ref 0.0–0.2)

## 2018-07-25 LAB — TYPE AND SCREEN
ABO/RH(D): O POS
Antibody Screen: NEGATIVE

## 2018-07-25 LAB — ABO/RH: ABO/RH(D): O POS

## 2018-07-25 MED ORDER — FLEET ENEMA 7-19 GM/118ML RE ENEM: 1.0000 | ENEMA | RECTAL | Status: DC | PRN

## 2018-07-25 MED ORDER — PHENYLEPHRINE 40 MCG/ML (10ML) SYRINGE FOR IV PUSH (FOR BLOOD PRESSURE SUPPORT)
80.0000 ug | PREFILLED_SYRINGE | INTRAVENOUS | Status: DC | PRN
  Administered 2018-07-25: 80 ug via INTRAVENOUS

## 2018-07-25 MED ORDER — ACETAMINOPHEN 325 MG PO TABS: 650.0000 mg | ORAL_TABLET | ORAL | Status: DC | PRN

## 2018-07-25 MED ORDER — OXYTOCIN BOLUS FROM INFUSION
500.0000 mL | Freq: Once | INTRAVENOUS | Status: AC
  Administered 2018-07-25: 500 mL via INTRAVENOUS

## 2018-07-25 MED ORDER — EPHEDRINE 5 MG/ML INJ
10.0000 mg | INTRAVENOUS | Status: DC | PRN
  Administered 2018-07-25: 10 mg via INTRAVENOUS

## 2018-07-25 MED ORDER — LACTATED RINGERS IV SOLN: 500.0000 mL | INTRAVENOUS | Status: DC | PRN

## 2018-07-25 MED ORDER — PHENYLEPHRINE 40 MCG/ML (10ML) SYRINGE FOR IV PUSH (FOR BLOOD PRESSURE SUPPORT)
80.0000 ug | PREFILLED_SYRINGE | INTRAVENOUS | Status: DC | PRN
  Filled 2018-07-25: qty 10

## 2018-07-25 MED ORDER — OXYCODONE-ACETAMINOPHEN 5-325 MG PO TABS: 2.0000 | ORAL_TABLET | ORAL | Status: DC | PRN

## 2018-07-25 MED ORDER — ONDANSETRON HCL 4 MG/2ML IJ SOLN: 4.0000 mg | Freq: Four times a day (QID) | INTRAMUSCULAR | Status: DC | PRN

## 2018-07-25 MED ORDER — LACTATED RINGERS IV SOLN
500.0000 mL | Freq: Once | INTRAVENOUS | Status: AC
  Administered 2018-07-25: 500 mL via INTRAVENOUS

## 2018-07-25 MED ORDER — FENTANYL CITRATE (PF) 100 MCG/2ML IJ SOLN
100.0000 ug | INTRAMUSCULAR | Status: DC | PRN
  Administered 2018-07-25 (×2): 100 ug via INTRAVENOUS
  Filled 2018-07-25 (×2): qty 2

## 2018-07-25 MED ORDER — OXYCODONE-ACETAMINOPHEN 5-325 MG PO TABS: 1.0000 | ORAL_TABLET | ORAL | Status: DC | PRN

## 2018-07-25 MED ORDER — LIDOCAINE HCL (PF) 1 % IJ SOLN
30.0000 mL | INTRAMUSCULAR | Status: AC | PRN
  Administered 2018-07-25: 30 mL via SUBCUTANEOUS
  Filled 2018-07-25: qty 30

## 2018-07-25 MED ORDER — SODIUM CHLORIDE (PF) 0.9 % IJ SOLN
INTRAMUSCULAR | Status: DC | PRN
  Administered 2018-07-25: 12 mL/h via EPIDURAL

## 2018-07-25 MED ORDER — DIPHENHYDRAMINE HCL 50 MG/ML IJ SOLN: 12.5000 mg | INTRAMUSCULAR | Status: DC | PRN

## 2018-07-25 MED ORDER — LIDOCAINE HCL (PF) 1 % IJ SOLN
INTRAMUSCULAR | Status: DC | PRN
  Administered 2018-07-25: 11 mL via EPIDURAL

## 2018-07-25 MED ORDER — SOD CITRATE-CITRIC ACID 500-334 MG/5ML PO SOLN: 30.0000 mL | ORAL | Status: DC | PRN

## 2018-07-25 MED ORDER — LACTATED RINGERS IV SOLN
INTRAVENOUS | Status: DC
  Administered 2018-07-25 (×2): via INTRAVENOUS

## 2018-07-25 MED ORDER — OXYTOCIN 40 UNITS IN NORMAL SALINE INFUSION - SIMPLE MED
2.5000 [IU]/h | INTRAVENOUS | Status: DC
  Filled 2018-07-25: qty 1000

## 2018-07-25 MED ORDER — FENTANYL-BUPIVACAINE-NACL 0.5-0.125-0.9 MG/250ML-% EP SOLN
12.0000 mL/h | EPIDURAL | Status: DC | PRN
  Filled 2018-07-25: qty 250

## 2018-07-25 MED ORDER — EPHEDRINE 5 MG/ML INJ
10.0000 mg | INTRAVENOUS | Status: DC | PRN
  Filled 2018-07-25: qty 10

## 2018-07-25 NOTE — MAU Note (Signed)
Pt brought to rm via wc from lobby.  Pt in tears.  Contractions started at 5, water broke at same time.  Wants water birth.

## 2018-07-25 NOTE — Anesthesia Procedure Notes (Signed)
Epidural Patient location during procedure: OB Start time: 07/25/2018 9:20 PM End time: 07/25/2018 9:35 PM  Staffing Anesthesiologist: Lowella Curb, MD Performed: anesthesiologist   Preanesthetic Checklist Completed: patient identified, site marked, surgical consent, pre-op evaluation, timeout performed, IV checked, risks and benefits discussed and monitors and equipment checked  Epidural Patient position: sitting Prep: ChloraPrep Patient monitoring: heart rate, cardiac monitor, continuous pulse ox and blood pressure Approach: midline Location: L2-L3 Injection technique: LOR saline  Needle:  Needle type: Tuohy  Needle gauge: 17 G Needle length: 9 cm Needle insertion depth: 5 cm Catheter type: closed end flexible Catheter size: 20 Guage Catheter at skin depth: 9 cm Test dose: negative  Assessment Events: blood not aspirated, injection not painful, no injection resistance, negative IV test and no paresthesia  Additional Notes Reason for block:procedure for pain

## 2018-07-25 NOTE — H&P (Signed)
  Samantha Casey is an 27 y.o. G24P0010 [redacted]w[redacted]d female.   Chief Complaint: Labor  HPI: Notes labor and ROM at 1700 today.   Past Medical History:  Diagnosis Date  . Allergy   . Anemia   . Anxiety   . Asthma   . Depression    doing ok now  . Gastroparesis   . Headache   . Scoliosis   . Seizures (HCC)    "slighltly epileptic", last seizure was "years ago"  . Substance abuse [facility])     Past Surgical History:  Procedure Laterality Date  . NO PAST SURGERIES    . UPPER GI ENDOSCOPY      Family History  Problem Relation Age of Onset  . Hyperlipidemia Mother   . Hypertension Mother   . Hyperlipidemia Father   . Stroke Father   . Diabetes Father   . Hypertension Father   . Heart disease Father   . Mental illness Sister   . Cancer Paternal Grandfather   . Hyperlipidemia Paternal Grandfather    Social History:  reports that she quit smoking about 7 months ago. Her smoking use included cigarettes. She smoked 0.50 packs per day. She has never used smokeless tobacco. She reports current alcohol use. She reports current drug use. Drug: Marijuana.    Allergies  Allergen Reactions  . Hydrocodone Nausea And Vomiting  . Milk-Related Compounds Other (See Comments)    Intolerance - upset stomach    Medications Prior to Admission  Medication Sig Dispense Refill  . acetaminophen (TYLENOL) 500 MG tablet Take 500 mg by mouth every 6 (six) hours as needed.    Marland Kitchen albuterol (ACCUNEB) 1.25 MG/3ML nebulizer solution Take 1 ampule by nebulization every 6 (six) hours as needed for wheezing.    . Prenatal Vit-Fe Fumarate-FA (PRENATAL MULTIVITAMIN) TABS tablet Take 1 tablet by mouth daily at 12 noon.       A comprehensive review of systems was negative.  Blood pressure 127/83, pulse 98, temperature 97.6 F (36.4 C), temperature source Oral, resp. rate 20, last menstrual period 10/08/2017. General appearance: alert, cooperative and appears stated age Head: Normocephalic, without obvious  abnormality, atraumatic Neck: supple, symmetrical, trachea midline Lungs: normal effort Heart: regular rate and rhythm Abdomen: gravid, non-tender Extremities: Homans sign is negative, no sign of DVT Skin: Skin color, texture, turgor normal. No rashes or lesions Neurologic: Grossly normal   Lab Results  Component Value Date   WBC 6.0 05/27/2018   HGB 10.6 (L) 05/27/2018   HCT 31.8 (L) 05/27/2018   MCV 85 05/27/2018   PLT 275 05/27/2018         ABO, Rh: O/Positive/-- (09/12 1139)  Antibody: Negative (09/12 1139)  Rubella: 3.91 (09/12 1139)  RPR: Non Reactive (01/06 0835)  HBsAg: Negative (09/12 1139)  HIV: Non Reactive (01/06 0835)  GBS:       Assessment/Plan Active Problems:   Active preterm labor   Thin meconium stained amniotic fluid  For admission Desires water birth--MSF, may not make this good candidate Needs a more reactive NST   Reva Bores 07/25/2018, 6:12 PM

## 2018-07-25 NOTE — Anesthesia Pain Management Evaluation Note (Signed)
  CRNA Pain Management Visit Note  Patient: Samantha Casey, 28 y.o., female  "Hello I am a member of the anesthesia team at [facility]. We have an anesthesia team available at all times to provide care throughout the hospital, including epidural management and anesthesia for C-section. I don't know your plan for the delivery whether it a natural birth, water birth, IV sedation, nitrous supplementation, doula or epidural, but we want to meet your pain goals."   1.Was your pain managed to your expectations on prior hospitalizations?   Yes   2.What is your expectation for pain management during this hospitalization?     Epidural and IV pain meds  3.How can we help you reach that goal? Epidural infusing, experiencing pelvic pain and pelvic pressure currently  Record the patient's initial score and the patient's pain goal.   Pain: 5  Pain Goal: 7 [facility] wants you to be able to say your pain was always managed very well.  [facility] 07/25/2018

## 2018-07-25 NOTE — H&P (Addendum)
OBSTETRIC ADMISSION HISTORY AND PHYSICAL  Samantha Casey is a 28 y.o. female G2P0010 with IUP at 61w6dby ultrasound presenting for contractions every 2-3 mins. While in MAU, SROM '@1700' -light meconium. Pt denies vaginal bleeding. Reports good fetal movement. Pt is considering waterbirth. Pt breathing well through contractions, but requesting pain medication. Family at bedside.   [facility]  Support person in labor: family and friends  Ultrasounds . Anatomy U/S: normal  Prenatal History/Complications: . None  Past Medical History: Past Medical History:  Diagnosis Date  . Allergy   . Anemia   . Anxiety   . Asthma   . Depression    doing ok now  . Gastroparesis   . Headache   . Scoliosis   [place]    "sl[place]ly epileptic", last seizure was "years ago"  . Substance abuse ([facility]     Past Surgical History: Past Surgical History:  Procedure Laterality Date  . NO PAST SURGERIES    . UPPER GI ENDOSCOPY      Obstetrical History: OB History    Gravida  2   Para  0   Term  0   Preterm  0   AB  1   Living  0     SAB  1   TAB  0   Ectopic  0   Multiple  0   Live Births  0           Social History: Social History   Socioeconomic History  . Marital status: Single    Spouse name: Not on file  . Number of children: 0  . Years of education: Not on file  . Highest education level: Not on file  Occupational History  . Not on file  Social Needs  . Financial resource strain: Not on file  . Food insecurity:    Worry: Sometimes true    Inability: Sometimes true  . Transportation needs:    Medical: No    Non-medical: No  Tobacco Use  . Smoking status: Current Every Day Smoker    Packs/day: 0.50    Types: Cigarettes    Last attempt to quit: 12/18/2017    Years since quitting: 0.6  . Smokeless tobacco: Never Used  . Tobacco comment: 2 cigs a day  Substance and Sexual Activity  . Alcohol use: Yes    Comment: last  was end of Feb  . Drug use: Yes    Types: Marijuana    Comment: end of Feb  . Sexual activity: Never    Birth control/protection: None  Lifestyle  . Physical activity:    Days per week: Not on file    Minutes per session: Not on file  . Stress: Not on file  Relationships  . Social connections:    Talks on phone: Not on file    Gets together: Not on file    Attends religious service: Not on file    Active member of club or organization: Not on file    Attends meetings of clubs or organizations: Not on file    Relationship status: Not on file  Other Topics Concern  . Not on file  Social History Narrative  . Not on file    Family History: Family History  Problem Relation Age of Onset  . Hyperlipidemia Mother   . Hypertension Mother   . Hyperlipidemia Father   . Stroke Father   . Diabetes Father   . Hypertension Father   .  Heart disease Father   . Mental illness Sister   . Cancer Paternal Grandfather   . Hyperlipidemia Paternal Grandfather     Allergies: Allergies  Allergen Reactions  . Hydrocodone Nausea And Vomiting  . Milk-Related Compounds Other (See Comments)    Intolerance - upset stomach    Medications Prior to Admission  Medication Sig Dispense Refill Last Dose  . acetaminophen (TYLENOL) 500 MG tablet Take 500 mg by mouth every 6 (six) hours as needed.   Not Taking  . albuterol (ACCUNEB) 1.25 MG/3ML nebulizer solution Take 1 ampule by nebulization every 6 (six) hours as needed for wheezing.   Taking  . Prenatal Vit-Fe Fumarate-FA (PRENATAL MULTIVITAMIN) TABS tablet Take 1 tablet by mouth daily at 12 noon.   Taking     Review of Systems  All systems reviewed and negative except as stated in HPI  Blood pressure 127/83, pulse 98, temperature 97.6 F (36.4 C), temperature source Oral, resp. rate 20, last menstrual period 10/08/2017. General appearance: alert, cooperative, appears stated age and moderate distress Lungs: no respiratory distress Heart:  regular rate  Abdomen: soft, non-tender; gravid  Extremities: Homans sign is negative, no sign of DVT Presentation: cephalic Fetal monitoring: continuous; FHR 140, moderate variability, +accels, no decels Uterine activity: contractions q3-4 Dilation: 3 Effacement (%): 60 Station: -2 Exam by:: Mingo Amber RN  Prenatal labs: ABO, Rh: --/--/PENDING (03/05 1800) Antibody: PENDING (03/05 1800) Rubella: 3.91 (09/12 1139) RPR: Non Reactive (01/06 0835)  HBsAg: Negative (09/12 1139)  HIV: Non Reactive (01/06 0835)  GBS: Negative (02/04 0000)  Glucola: normal Genetic screening: negative  Prenatal Transfer Tool  Maternal Diabetes: No Genetic Screening: Normal Maternal Ultrasounds/Referrals: Normal Fetal Ultrasounds or other Referrals:  None Maternal Substance Abuse:  No Significant Maternal Medications:  None Significant Maternal Lab Results: None  Results for orders placed or performed during the hospital encounter of 07/25/18 (from the past 24 hour(s))  CBC   Collection Time: 07/25/18  6:00 PM  Result Value Ref Range   WBC 8.2 4.0 - 10.5 K/uL   RBC 4.07 3.87 - 5.11 MIL/uL   Hemoglobin 11.4 (L) 12.0 - 15.0 g/dL   HCT 34.8 (L) 36.0 - 46.0 %   MCV 85.5 80.0 - 100.0 fL   MCH 28.0 26.0 - 34.0 pg   MCHC 32.8 30.0 - 36.0 g/dL   RDW 13.1 11.5 - 15.5 %   Platelets 305 150 - 400 K/uL   nRBC 0.0 0.0 - 0.2 %  Type and screen [place]   Collection Time: 07/25/18  6:00 PM  Result Value Ref Range   ABO/RH(D) PENDING    Antibody Screen PENDING    Sample Expiration      07/28/2018 Performed at [facility], [address], [place], [place] [address]     Patient Active Problem List   Diagnosis Date Noted  . Active preterm labor 07/25/2018  . Thin meconium stained amniotic fluid 07/25/2018  . Normal labor 07/25/2018  . History of tooth extraction 04/25/2018  . Alcohol use affecting pregnancy in second trimester 02/28/2018  . Supervision of other normal  pregnancy, antepartum 01/31/2018  . Hx of migraines 01/31/2018  . Trichomonas infection 01/24/2018    Assessment/Plan:  Samantha Casey is a 28 y.o. G2P0010 at 24w6dhere for SROM/SOL.   Labor: expectant management. Pt contracting every 3-4 mins. Will continue to monitor.  -- pain control: IV pain medication  Fetal Wellbeing:  -- EFW 6lbs. 10 oz by 35.5 week  ultrasound on 05/29/47. -- Cephalic by SVE.  -- GBS negative -- Continuous fetal monitoring   Postpartum Planning -- breast/bottle -- contraception: unsure -- needs MMR    I personally saw and evaluated the patient, performing the key elements of the service. I developed and verified the management plan that is described in the resident's/student's note, and I agree with the content with my edits above.  Nigel Berthold, CNM 07/25/2018 10:33 PM

## 2018-07-25 NOTE — Discharge Summary (Addendum)
Postpartum Discharge Summary     Patient Name: Samantha Casey DOB: 01/15/1991 MRN: [id number]  Date of admission: 07/25/2018 Delivering Provider: Lavonia Drafts   Date of discharge: 07/27/2018  Admitting diagnosis: CTX Intrauterine pregnancy: [redacted]w[redacted]d    Secondary diagnosis:  Active Problems:   Thin meconium stained amniotic fluid   Normal labor   Asthma   Depression with anxiety   Drug use affecting pregnancy  Additional problems: none     Discharge diagnosis: Term Pregnancy Delivered                                                                                                Post partum procedures: MMR recommended prior to d/c  Augmentation: none  Complications: bradycardia (most likely 2/2 hypotension s/p epidural/rapid progression)  Hospital course:  Onset of Labor With Vaginal Delivery     28y.o. yo G2P0010 at 345w6das admitted in Latent Labor on 07/25/2018. She progressed spontaneously to complete and then had a fetal bradycardia after epidural placement necessitating a vacuum extraction. See Delivery Note for details.   Membrane Rupture Time/Date: 5:00 PM ,07/25/2018   Intrapartum Procedures: Episiotomy: None [1]                                         Lacerations:  1st degree [2]  Patient had a delivery of a Viable infant. 07/25/2018  Information for the patient's newborn:  DoRayshawn, Visconti0[[id number]]Delivery Method: Vaginal, Vacuum (Extractor)(Filed from Delivery Summary)    Pateint had an uncomplicated postpartum course.  She is ambulating, tolerating a regular diet, passing flatus, and urinating well. Patient is discharged home in stable condition on 07/27/18. SW made a CPS report due to infant drug exposure, but there are no barriers to discharge.   Magnesium Sulfate recieved: No BMZ received: No  Physical exam  Vitals:   07/26/18 0600 07/26/18 1509 07/26/18 2235 07/27/18 0552  BP: 104/72 119/80 125/68 108/77  Pulse: 73 72 70 (!) 59  Resp:   '20 16 18  ' Temp: 98.2 F (36.8 C) 98.5 F (36.9 C) 98.3 F (36.8 C) 98.3 F (36.8 C)  TempSrc:  Oral Oral Oral  SpO2: 100%      General: alert and cooperative Lochia: appropriate Uterine Fundus: firm Incision: N/A DVT Evaluation: No evidence of DVT seen on physical exam. Labs: Lab Results  Component Value Date   WBC 8.2 07/25/2018   HGB 11.4 (L) 07/25/2018   HCT 34.8 (L) 07/25/2018   MCV 85.5 07/25/2018   PLT 305 07/25/2018   CMP Latest Ref Rng & Units 06/07/2017  Glucose 65 - 99 mg/dL 95  BUN 6 - 20 mg/dL 8  Creatinine 0.44 - 1.00 mg/dL 0.71  Sodium 135 - 145 mmol/L 137  Potassium 3.5 - 5.1 mmol/L 3.5  Chloride 101 - 111 mmol/L 103  CO2 22 - 32 mmol/L 26  Calcium 8.9 - 10.3 mg/dL 9.1  Total Protein 6.5 - 8.1 g/dL 7.2  Total Bilirubin 0.3 - 1.2  mg/dL 0.2(L)  Alkaline Phos 38 - 126 U/L 67  AST 15 - 41 U/L 15  ALT 14 - 54 U/L 10(L)    Discharge instruction: per After Visit Summary and "Baby and Me Booklet".  After visit meds:  Allergies as of 07/27/2018      Reactions   Hydrocodone Nausea And Vomiting   Lactalbumin Itching, Other (See Comments)   Intolerance - upset stomach Intolerance - upset stomach   Milk-related Compounds Other (See Comments)   Intolerance - upset stomach      Medication List    TAKE these medications   albuterol 108 (90 Base) MCG/ACT inhaler Commonly known as:  PROVENTIL HFA;VENTOLIN HFA Inhale 2 puffs into the lungs daily.   ibuprofen 600 MG tablet Commonly known as:  ADVIL,MOTRIN Take 1 tablet (600 mg total) by mouth every 6 (six) hours.   prenatal multivitamin Tabs tablet Take 1 tablet by mouth daily at 12 noon.   senna-docusate 8.6-50 MG tablet Commonly known as:  Senokot-S Take 2 tablets by mouth daily. Start taking on:  July 28, 2018       Diet: routine diet  Activity: Advance as tolerated. Pelvic rest for 6 weeks.   Outpatient follow up:4 weeks Follow up Appt: Future Appointments  Date Time Provider [place]  08/29/2018  3:15 PM Rasch, Artist Pais, NP [facility]   Follow up Visit:    Newborn Data: Live born female  Birth Weight: 2906gm APGAR: 18, 9  Newborn Delivery   Birth date/time:  07/25/2018 22:03:00 Delivery type:  Vaginal, Vacuum (Extractor)     Baby Feeding: Breast Disposition:home with mother   07/27/2018 Myrtis Ser, CNM  9:40 AM

## 2018-07-25 NOTE — Anesthesia Preprocedure Evaluation (Signed)
Anesthesia Evaluation  Patient identified by MRN, date of birth, ID band Patient awake    Reviewed: Allergy & Precautions, NPO status , Patient's Chart, lab work & pertinent test results  Airway Mallampati: II  TM Distance: >3 FB Neck ROM: Full    Dental no notable dental hx.    Pulmonary asthma , Current Smoker,    Pulmonary exam normal breath sounds clear to auscultation       Cardiovascular negative cardio ROS Normal cardiovascular exam Rhythm:Regular Rate:Normal     Neuro/Psych negative neurological ROS  negative psych ROS   GI/Hepatic negative GI ROS, Neg liver ROS,   Endo/Other  negative endocrine ROS  Renal/GU negative Renal ROS  negative genitourinary   Musculoskeletal negative musculoskeletal ROS (+)   Abdominal   Peds negative pediatric ROS (+)  Hematology negative hematology ROS (+)   Anesthesia Other Findings   Reproductive/Obstetrics (+) Pregnancy                             Anesthesia Physical Anesthesia Plan  ASA: II  Anesthesia Plan: Epidural   Post-op Pain Management:    Induction:   PONV Risk Score and Plan:   Airway Management Planned:   Additional Equipment:   Intra-op Plan:   Post-operative Plan:   Informed Consent:   Plan Discussed with:   Anesthesia Plan Comments:         Anesthesia Quick Evaluation

## 2018-07-26 LAB — RPR: RPR Ser Ql: NONREACTIVE

## 2018-07-26 MED ORDER — PRENATAL MULTIVITAMIN CH
1.0000 | ORAL_TABLET | Freq: Every day | ORAL | Status: DC
  Administered 2018-07-26: 1 via ORAL
  Filled 2018-07-26: qty 1

## 2018-07-26 MED ORDER — ACETAMINOPHEN 325 MG PO TABS
650.0000 mg | ORAL_TABLET | ORAL | Status: DC | PRN
  Administered 2018-07-26 (×2): 650 mg via ORAL
  Filled 2018-07-26 (×3): qty 2

## 2018-07-26 MED ORDER — IBUPROFEN 600 MG PO TABS
600.0000 mg | ORAL_TABLET | Freq: Four times a day (QID) | ORAL | Status: DC
  Administered 2018-07-26 – 2018-07-27 (×5): 600 mg via ORAL
  Filled 2018-07-26 (×5): qty 1

## 2018-07-26 MED ORDER — COCONUT OIL OIL: 1.0000 "application " | TOPICAL_OIL | Status: DC | PRN

## 2018-07-26 MED ORDER — ONDANSETRON HCL 4 MG PO TABS: 4.0000 mg | ORAL_TABLET | ORAL | Status: DC | PRN

## 2018-07-26 MED ORDER — TETANUS-DIPHTH-ACELL PERTUSSIS 5-2.5-18.5 LF-MCG/0.5 IM SUSP: 0.5000 mL | Freq: Once | INTRAMUSCULAR | Status: DC

## 2018-07-26 MED ORDER — ONDANSETRON HCL 4 MG/2ML IJ SOLN: 4.0000 mg | INTRAMUSCULAR | Status: DC | PRN

## 2018-07-26 MED ORDER — WITCH HAZEL-GLYCERIN EX PADS: 1.0000 "application " | MEDICATED_PAD | CUTANEOUS | Status: DC | PRN

## 2018-07-26 MED ORDER — DIPHENHYDRAMINE HCL 25 MG PO CAPS: 25.0000 mg | ORAL_CAPSULE | Freq: Four times a day (QID) | ORAL | Status: DC | PRN

## 2018-07-26 MED ORDER — SIMETHICONE 80 MG PO CHEW: 80.0000 mg | CHEWABLE_TABLET | ORAL | Status: DC | PRN

## 2018-07-26 MED ORDER — SENNOSIDES-DOCUSATE SODIUM 8.6-50 MG PO TABS
2.0000 | ORAL_TABLET | ORAL | Status: DC
  Administered 2018-07-26 – 2018-07-27 (×2): 2 via ORAL
  Filled 2018-07-26 (×2): qty 2

## 2018-07-26 MED ORDER — MEASLES, MUMPS & RUBELLA VAC IJ SOLR: 0.5000 mL | Freq: Once | INTRAMUSCULAR | Status: DC

## 2018-07-26 MED ORDER — DIBUCAINE 1 % RE OINT: 1.0000 "application " | TOPICAL_OINTMENT | RECTAL | Status: DC | PRN

## 2018-07-26 MED ORDER — BENZOCAINE-MENTHOL 20-0.5 % EX AERO
1.0000 "application " | INHALATION_SPRAY | CUTANEOUS | Status: DC | PRN
  Administered 2018-07-26: 1 via TOPICAL
  Filled 2018-07-26: qty 56

## 2018-07-26 NOTE — Lactation Note (Addendum)
This note was copied from a Samantha's chart. Lactation Consultation Note  Patient Name: Samantha Casey DPOEU'M Date: 07/26/2018   g1p1 vag delivery vacuum assist Samantha Casey now 44 hours old. Infant gassy per mom and aunt holding her and trying to get rid of gas.  Mom reports infant last ate at 5 pm.   Offered to assist with feeding.  Mom reports she has not latched on the left breast at all. Only the right.  Mom reports she had surgery for an abcess on left breast a few years ago.  Visible scar left breast middle upper inner quadrant.  Tissue not very compressible in that area.  Mom also had both nipples pierced.  Visible scarring around both nipples from piercings.  Assist with feeding in cross cradle hold on left breast. Infant latches but comes off and on. Nipple slightly compressed. Infant fussy at breast.  When infant cries. Tongue does not appear to elevate. Showed mom how nipple compressed past feeding  and she reports that is they way right nipple always looks. Urged massage and hand expression prior to feeding to help tissue be more compressible during feeding. Assist with football on left breast.  Minimal assist with positioning and latch.Mom asked about nipple cream.  Urged mom to hand express and rub expressed mothers milk on nipples and air dry and coconut oil as needed. When mom hand expresses on right milk comes out of each nipple hole.  Mom reports took breastfeeding class at [facility]. Mom reports she has a DEBP at home for use. Mom wearing shells on right breast breast. Left mom and Samantha breastfeeding on left.  Discussed possibly pumping if nipples continued to be compressed and infant not picking up on feedings some where aropund 24 hours.  Urged mom to call lactation as needed.  Reviewed Understanding mother and Samantha, Reviewed [facility] breastfeeding consultation and resource list.      Neomia Dear 07/26/2018, 8:43 PM

## 2018-07-26 NOTE — Anesthesia Postprocedure Evaluation (Signed)
Anesthesia Post Note  Patient: Samantha Casey  Procedure(s) Performed: AN AD HOC LABOR EPIDURAL     Patient location during evaluation: Mother Baby Anesthesia Type: Epidural Level of consciousness: awake and alert Pain management: pain level controlled Vital Signs Assessment: post-procedure vital signs reviewed and stable Respiratory status: spontaneous breathing, nonlabored ventilation and respiratory function stable Cardiovascular status: stable Postop Assessment: no headache, no backache, epidural receding, no apparent nausea or vomiting, patient able to bend at knees, able to ambulate and adequate PO intake Anesthetic complications: no    Last Vitals:  Vitals:   07/26/18 0125 07/26/18 0600  BP: 122/78 104/72  Pulse: 79 73  Resp: 16   Temp: 36.7 C 36.8 C  SpO2:  100%    Last Pain:  Vitals:   07/26/18 0125  TempSrc: Oral  PainSc: 2    Pain Goal:                   Billiejo Sorto Hristova

## 2018-07-26 NOTE — Clinical Social Work Maternal (Signed)
CLINICAL SOCIAL WORK MATERNAL/CHILD NOTE  Patient Details  Name: Samantha Casey MRN: [id number] Date of Birth: 07/29/1990  Date:  07/26/2018  Clinical Social Worker Initiating Note:  Wladyslaw Henrichs Irwin Date/Time: Initiated:  07/26/18/1323     Child's Name:  Ahnaliese Walbert   Biological Parents:  Mother, Father(Rockie Williams 10/09/1978)   Need for Interpreter:  None   Reason for Referral:  Behavioral Health Concerns, Current Substance Use/Substance Use During Pregnancy    Address:  [address] [place] [address]    Phone number:  [phone] (home)     Additional phone number:  Household Members/Support Persons (HM/SP):   Household Member/Support Person 1   HM/SP Name Relationship DOB or Age  HM/SP -1 Paneisha Mark Sister 02/23/2002  HM/SP -2        HM/SP -3        HM/SP -4        HM/SP -5        HM/SP -6        HM/SP -7        HM/SP -8          Natural Supports (not living in the home):  Extended Family, Immediate Family, Parent   Professional Supports:     Employment: Full-time   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   Education:  College graduate   Homebound arranged:    Financial Resources:  Medicaid   Other Resources:  WIC(Applied for Food Stamps, awaiting decision)   Cultural/Religious Considerations Which May Impact Care:    Strengths:  Ability to meet basic needs , Home prepared for child , Pediatrician chosen   Psychotropic Medications:         Pediatrician:    [place] area  Pediatrician List:   [place] [facility]  [place]    [place]    [place]    [place]    [place]      Pediatrician Fax Number:    Risk Factors/Current Problems:  Mental Health Concerns , Substance Use    Cognitive State:  Alert , Able to Concentrate , Insightful    Mood/Affect:  Bright , Calm , Comfortable , Interested , Relaxed    CSW Assessment:  CSW received consult for hx of anxiety, depression and current substance use.  CSW met with MOB to offer support and complete assessment.    MOB bonding with baby as evidenced by performing skin-to-skin contact, while FOB present at bedside. CSW introduced self and role and received verbal permission to have FOB leave the room while assessment was performed, FOB left voluntarily. CSW explained reason for consult and MOB expressed understanding. MOB currently resides in own residence and little sister resides with MOB. MOB employed full-time at [employer] and reported she does not have to return to work until 5/7. MOB reported she was started on WIC during her pregnancy and has attempted to apply for food stamps in the past but was declined. MOB stated she recently re-applied since she is on short-term disability and is awaiting final decision.   CSW inquired about MOB's mental health history. MOB open and honest with CSW and shared she was diagnosed with Borderline Personality Disorder with Mania, Anxiety and Depression about 6 years ago. MOB reported she did counseling at that time but is not currently interested as she reports no current mental health symptoms. MOB stated she experienced some anxiety and depression during pregnancy but that it was manageable.   MOB reported she is currently using "non-legal herbs" to help her cope such as essential oils. MOB reported these as being "very helpful" in managing her symptoms. MOB denied any SI/HI or DV in the home. MOB listed her parents, and her grandma as supports if ever she needed them. CSW provided education regarding the baby blues period vs. perinatal mood disorders given MOB's mental health history. CSW recommended self-evaluation during the postpartum time period using the New Mom Checklist from Postpartum Progress and encouraged MOB to contact a medical professional if symptoms are noted at any time.    CSW inquired about MOB's substance use history. MOB  open and honest with CSW and shared that she drank wine and used THC during her pregnancy. MOB reported she last drank wine a week ago and used THC the day before giving birth. CSW explained [place] and reported UDS and CDS were still pending. CSW explained CSW would make a CPS report, if warranted. CSW explained CPS referral process and MOB expressed understanding.   MOB confirmed she had all essential items for baby once discharged and reported baby would be slipping in a crib once home. CSW provided review of Sudden Infant Death Syndrome (SIDS) precautions and Safe Sleeping Habits. MOB receptive and denied any questions or concerns.    MOB denied having any further questions or concerns for CSW. CSW identified no further need for intervention and no barriers to discharge at this time.   CSW Plan/Description:  No Further Intervention Required/No Barriers to Discharge, Sudden Infant Death Syndrome (SIDS) Education, Perinatal Mood and Anxiety Disorder (PMADs) Education, [place], CSW Will Continue to Monitor Umbilical Cord Tissue Drug Screen Results and Make Report if Warranted    Ollen Barges, [place] 07-28-18, 2:27 PM

## 2018-07-26 NOTE — Progress Notes (Addendum)
Post Partum Day 1 Patient is a 27 y.o. now G2P1011 s/p NSVD at [redacted]w[redacted]d who was admitted for SOL. SROM   Subjective: no complaints, up ad lib, voiding and tolerating PO Denies HA, Visual issues, SOB, RUQ pain, no LE swelling and pain.   Objective: Blood pressure 104/72, pulse 73, temperature 98.2 F (36.8 C), resp. rate 16, last menstrual period 10/08/2017, SpO2 100 %, unknown if currently breastfeeding.  Physical Exam:  General: alert, cooperative and no distress Lochia: appropriate Uterine Fundus: firm DVT Evaluation: No cords or calf tenderness. No significant calf/ankle edema.  Recent Labs    07/25/18 1800  HGB 11.4*  HCT 34.8*    Assessment/Plan: Plan for discharge tomorrow Infant is currently not okay for home and will be comfortable to go home when her infant is discharged.    LOS: 1 day   [name]3/10/2018, 9:28 AM   I personally saw and evaluated the patient, performing the key elements of the service. I developed and verified the management plan that is described in the resident's/student's note, and I agree with the content with my edits above. VSS, HRR&R, Resp unlabored, Legs neg.  FNigel Berthold CNM 07/29/2018 8:55 AM

## 2018-07-27 MED ORDER — TETANUS-DIPHTH-ACELL PERTUSSIS 5-2.5-18.5 LF-MCG/0.5 IM SUSP: 0.5000 mL | Freq: Once | INTRAMUSCULAR | 0 refills | Status: DC

## 2018-07-27 MED ORDER — MEASLES, MUMPS & RUBELLA VAC IJ SOLR: 0.5000 mL | Freq: Once | INTRAMUSCULAR | 0 refills | Status: DC

## 2018-07-27 MED ORDER — ACETAMINOPHEN 325 MG PO TABS: 650.0000 mg | ORAL_TABLET | ORAL | 0 refills | Status: DC | PRN

## 2018-07-27 MED ORDER — IBUPROFEN 600 MG PO TABS: 600.0000 mg | ORAL_TABLET | Freq: Four times a day (QID) | ORAL | 0 refills | Status: AC

## 2018-07-27 MED ORDER — SENNOSIDES-DOCUSATE SODIUM 8.6-50 MG PO TABS: 2.0000 | ORAL_TABLET | ORAL | 0 refills | Status: AC

## 2018-07-27 NOTE — Lactation Note (Signed)
This note was copied from a baby's chart. Lactation Consultation Note:   Mother reports that she just finished a 20 min feeding.  LC offered to assist mother with latching infant in the alernate breast.   Mother reports having a hand pump and advised to use hand pump after feedings for 15 min on each breast.   Mother denies having any breast or nipple or breast discomfort.   Encouraged mother to continue to cue base feed infant. Advised to breast feed at least 8-12 times or more in 24 hours. Discussed normal cluster feeding.  Discussed treatment and prevention of engorgement .   Mother receptive to all teaching. Mother is aware of available  LC services at [facility]. Advised mother to phone for breastfeeding questions or concerns.    Patient Name: Samantha Casey [id number] Date: 07/27/2018 Reason for consult: Follow-up assessment   Maternal Data    Feeding Feeding Type: Breast Fed  LATCH Score Latch: Grasps breast easily, tongue down, lips flanged, rhythmical sucking.(with latching help)  Audible Swallowing: A few with stimulation  Type of Nipple: Everted at rest and after stimulation  Comfort (Breast/Nipple): Soft / non-tender  Hold (Positioning): No assistance needed to correctly position infant at breast.  LATCH Score: 9  Interventions Interventions: Skin to skin;Breast massage;Hand express;Expressed milk;Hand pump;DEBP  Lactation Tools Discussed/Used     Consult Status Consult Status: Complete    Michel Bickers 07/27/2018, 11:03 AM

## 2018-07-27 NOTE — Progress Notes (Signed)
CSW made CPS report to Pia Mau of [facility] CPS to make a notification of a substance exposed newborn. Infant tested positive for marijuana.   CPS accepted report but stated no barriers to discharge at this time. Follow up will occur after discharge.  Edwin Dada, MSW, LCSW-A Clinical Social Worker [facility] [employer] [phone]

## 2018-07-27 NOTE — Discharge Instructions (Signed)
Alcohol Abuse and Nutrition Alcohol abuse is any pattern of alcohol consumption that harms your health, relationships, or work. Alcohol abuse can cause poor nutrition (malnutrition or malnourishment) and a lack of nutrients (nutrient deficiencies), which can lead to more complications. Alcohol abuse brings malnutrition and nutrient deficiencies in two ways:  It causes your liver to work abnormally. This affects how your body divides (breaks down) and absorbs nutrients from food.  It causes you to eat poorly. Many people who abuse alcohol do not eat enough carbohydrates, protein, fat, vitamins, and minerals. Nutrients that are commonly lacking (deficient) in people who abuse alcohol include:  Vitamins. ? Vitamin A. This is needed for your vision, metabolism, and ability to fight off infections (immunity). ? B vitamins. These include folate, thiamine, and niacin. These are needed for new cell growth. ? Vitamin C. This plays an important role in wound healing, immunity, and helping your body to absorb iron. ? Vitamin D. This is necessary for your body to absorb and use calcium. It is produced by your liver, but you can also get it from food and from sun exposure.  Minerals. ? Calcium. This is needed for healthy bones as well as heart and blood vessel (cardiovascular) function. ? Iron. This is important for blood, muscle, and nervous system functioning. ? Magnesium. This plays an important role in muscle and nerve function, and it helps to control blood sugar and blood pressure. ? Zinc. This is important for the normal functioning of your nervous system and digestive system (gastrointestinal tract). If you think that you have an alcohol dependency problem, or if it is hard to stop drinking because you feel sick or different when you do not use alcohol, talk with your health care provider or another health professional about where to get help. Nutrition is an essential factor in therapy for alcohol  abuse. Your health care provider or diet and nutrition specialist (dietitian) will work with you to design a plan that can help to restore nutrients to your body and prevent the risk of complications. What is my plan? Your dietitian may develop a specific eating plan that is based on your condition and any other problems that you have. An eating plan will commonly include:  A balanced diet. ? Grains: 6-8 oz (170-227 g) a day. Examples of 1 oz of whole grains include 1 cup of whole-wheat cereal,  cup of brown rice, or 1 slice of whole-wheat bread. ? Vegetables: 2-3 cups a day. Examples of 1 cup of vegetables include 2 medium carrots, 1 large tomato, or 2 stalks of celery. ? Fruits: 1-2 cups a day. Examples of 1 cup of fruit include 1 large banana, 1 small apple, 8 large strawberries, or 1 large orange. ? Meat and other protein: 5-6 oz (142-170 g) a day.  A cut of meat or fish that is the size of a deck of cards is about 3-4 oz.  Foods that provide 1 oz of protein include 1 egg,  cup of nuts or seeds, or 1 tablespoon (16 g) of peanut butter. ? Dairy: 2-3 cups a day. Examples of 1 cup of dairy include 8 oz (230 mL) of milk, 8 oz (230 g) of yogurt, or 1 oz (44 g) of natural cheese.  Vitamin and mineral supplements. What are tips for following this plan?  Eat frequent meals and snacks. Try to eat 5-6 small meals each day.  Take vitamin or mineral supplements as recommended by your dietitian.  If you are malnourished  or if your dietitian recommends it: ? You may follow a high-protein, high-calorie diet. This may include:  2,000-3,000 calories (kilocalories) a day.  70-100 g (grams) of protein a day. ? You may be directed to follow a diet that includes a complete nutritional supplement beverage. This can help to restore calories, protein, and vitamins to your body. Depending on your condition, you may be advised to consume this beverage instead of your meals or in addition to them.  Certain  medicines may cause changes in your appetite, taste, and weight. Work with your health care provider and dietitian to make any changes to your medicines and eating plan.  If you are unable to take in enough food and calories by mouth, your health care provider may recommend a feeding tube. This tube delivers nutritional supplements directly to your stomach. Recommended foods  Eat foods that are high in molecules that prevent oxygen from reacting with your food (antioxidants). These foods include grapes, berries, nuts, green tea, and dark green or orange vegetables. Eating these can help to prevent some of the stress that is placed on your liver by consuming alcohol.  Eat a variety of fresh fruits and vegetables each day. This will help you to get fiber and vitamins in your diet.  Drink plenty of water and other clear fluids, such as apple juice and broth. Try to drink at least 48-64 oz (1.5-2 L) of water a day.  Include foods fortified with vitamins and minerals in your diet. Commonly fortified foods include milk, orange juice, cereal, and bread.  Eat a variety of foods that are high in omega-3 and omega-6 fatty acids. These include fish, nuts and seeds, and soybeans. These foods may help your liver to recover and may also stabilize your mood.  If you are a vegetarian: ? Eat a variety of protein-rich foods. ? Pair whole grains with plant-based proteins at meals and snack time. For example, eat rice with beans, put peanut butter on whole-grain toast, or eat oatmeal with sunflower seeds. The items listed above may not be a complete list of foods and beverages you can eat. Contact a dietitian for more information. Foods to avoid  Avoid foods and drinks that are high in fat and sugar. Sugary drinks, salty snacks, and candy contain empty calories. This means that they lack important nutrients such as protein, fiber, and vitamins.  Avoid alcohol. This is the best way to avoid malnutrition due to  alcohol abuse. If you must drink, drink measured amounts. Measured drinking means limiting your intake to no more than 1 drink a day for nonpregnant women and 2 drinks a day for men. One drink equals 12 oz (355 mL) of beer, 5 oz (148 mL) of wine, or 1 oz (44 mL) of hard liquor.  Limit your intake of caffeine. Replace drinks like coffee and black tea with decaffeinated coffee and decaffeinated herbal tea. The items listed above may not be a complete list of foods and beverages you should avoid. Contact a dietitian for more information. Summary  Alcohol abuse can cause poor nutrition (malnutrition or malnourishment) and a lack of nutrients (nutrient deficiencies), which can lead to more health problems.  Common nutrient deficiencies include vitamin deficiencies (A, B, C, and D) and mineral deficiencies (calcium, iron, magnesium, and zinc).  Nutrition is an essential factor in therapy for alcohol abuse.  Your health care provider and dietitian can help you to develop a specific eating plan that includes a balanced diet plus vitamin and  mineral supplements. This information is not intended to replace advice given to you by your health care provider. Make sure you discuss any questions you have with your health care provider. Document Released: 03/02/2005 Document Revised: 01/09/2018 Document Reviewed: 01/23/2017 Elsevier Interactive Patient Education  2019 [employer].   Alcohol Use During Pregnancy  Drinking alcohol while you are pregnant can harm your unborn baby and cause long-term problems for your baby after birth. When you drink alcohol, it passes through your placenta to your baby. A baby's liver cannot process alcohol like an adult's liver can, because it is not mature enough. How does this affect me? Drinking alcohol during pregnancy can increase your risk of:  Miscarriage.  Stillbirth.  Premature delivery.  Injury from falls.  Domestic violence.  Mood disorders, such as  depression and postpartum depression. How does this affect my baby? Drinking alcohol during pregnancy can cause fetal alcohol spectrum disorder in your unborn baby (fetus). You are at the highest risk of having a baby affected by alcohol if you have 7 or more drinks a week through most of your pregnancy. Fetal alcohol spectrum disorder (FASD) is a group of birth defects that can occur in a child whose mother drank excessive amounts of alcohol during pregnancy. Defects can include:  Behavior and attention problems.  Disorders of the brain and spinal cord (central nervous system disorders).  Problems with the heart, kidneys, and bones.  Decreased growth before and after birth.  Learning problems and intellectual disabilities.  Speech and language problems.  Vision and hearing problems.  Smaller head size than normal.  Sleep and sucking disorders.  Changes in the shape of the face. The only way to prevent the condition is to stop drinking alcohol the moment you realize that you are pregnant. Can I have any alcohol during my pregnancy? No amount of alcohol is safe during pregnancy. Because of this, it is best not to consume any alcohol at all. Do not drink alcohol if:  You plan to become pregnant.  You think you may be pregnant. If you are struggling not to drink, ask your health care provider for help. If you have a drinking problem, the sooner you get help to stop, the better your chance of having a healthy baby. Where to find more information  [employer] on Alcohol Abuse and Alcoholism: [employer]  Substance Abuse and Mental Health Services Administration: www.fascenter.RockToxic.pl Summary  Drinking alcohol during your pregnancy can cause life-long harm to your developing baby.  No amount of alcohol is safe during pregnancy.  Stop drinking alcohol if you think you are pregnant or could become pregnant.  If you are pregnant and you are not able to stop drinking  alcohol, talk with your health care provider right away. This information is not intended to replace advice given to you by your health care provider. Make sure you discuss any questions you have with your health care provider. Document Released: 03/05/2007 Document Revised: 01/11/2017 Document Reviewed: 01/11/2017 Elsevier Interactive Patient Education  2019 [employer].

## 2018-07-31 ENCOUNTER — Encounter: Payer: Self-pay | Admitting: Medical

## 2018-08-29 ENCOUNTER — Ambulatory Visit: Payer: Self-pay | Admitting: Obstetrics and Gynecology

## 2018-11-29 DIAGNOSIS — F418 Other specified anxiety disorders: Secondary | ICD-10-CM | POA: Diagnosis not present

## 2018-11-29 DIAGNOSIS — F17210 Nicotine dependence, cigarettes, uncomplicated: Secondary | ICD-10-CM | POA: Diagnosis not present

## 2018-11-29 DIAGNOSIS — Z0000 Encounter for general adult medical examination without abnormal findings: Secondary | ICD-10-CM | POA: Diagnosis not present

## 2018-12-11 IMAGING — US US OB COMP LESS 14 WK
1 series · 15 of 15 positions shown · non-contrast
Comparison: None.

CLINICAL DATA: Abdominal pain and vaginal bleeding.

EXAM:
OBSTETRIC <14 WK ULTRASOUND
TECHNIQUE: Transabdominal ultrasound was performed for evaluation of the
gestation as well as the maternal uterus and adnexal regions.

[Series 1: us ob comp less 14 wk · 15 acquisitions, 15 frames shown]
[im 1/15]
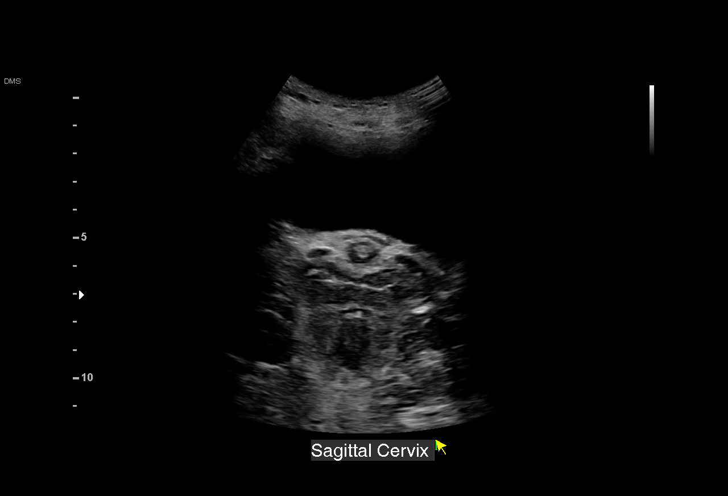
[im 2/15]
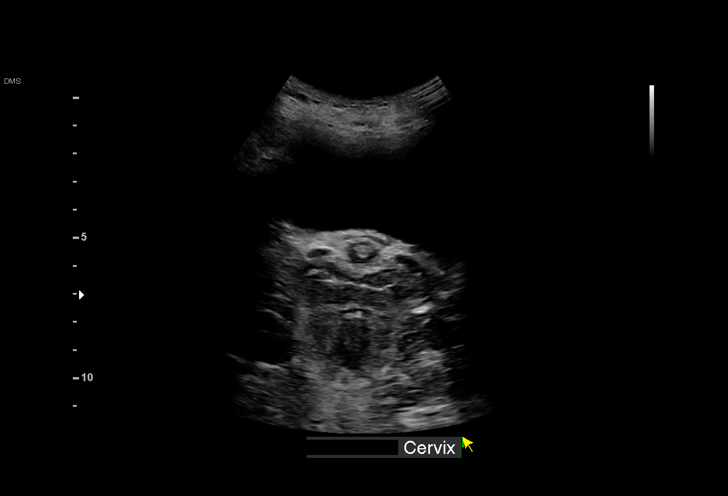
[im 3/15]
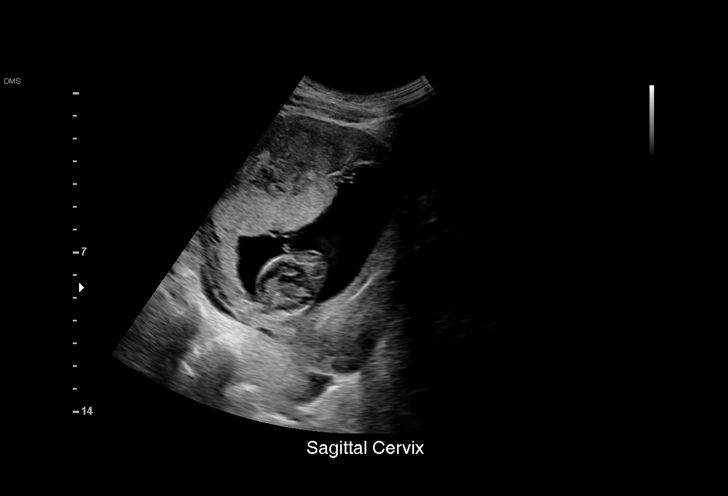
[im 4/15]
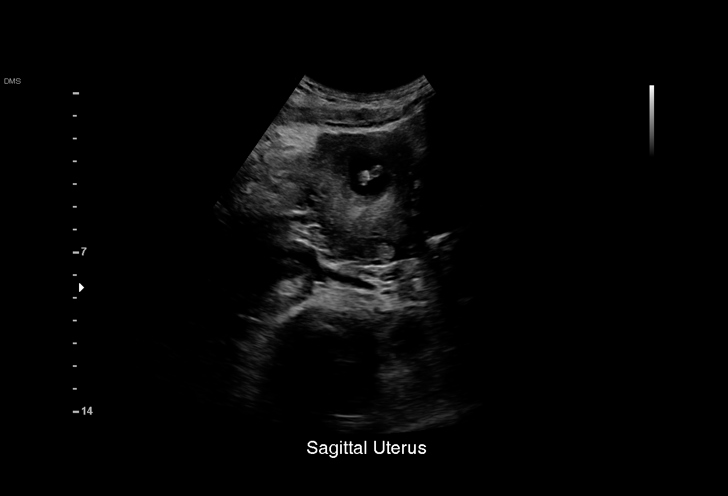
[im 5/15]
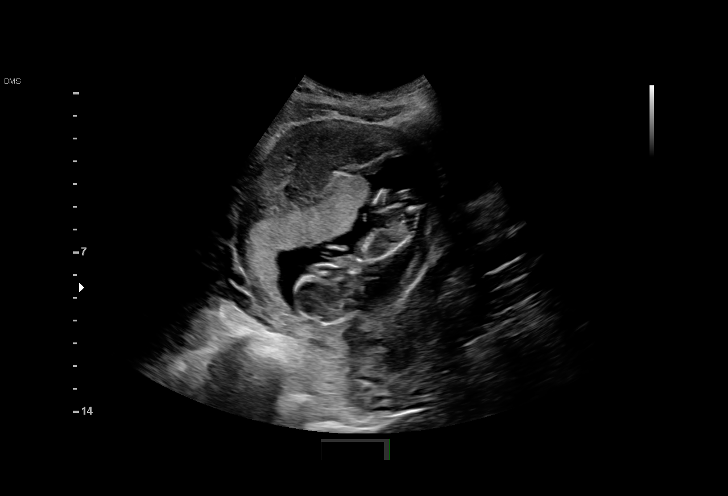
[im 6/15]
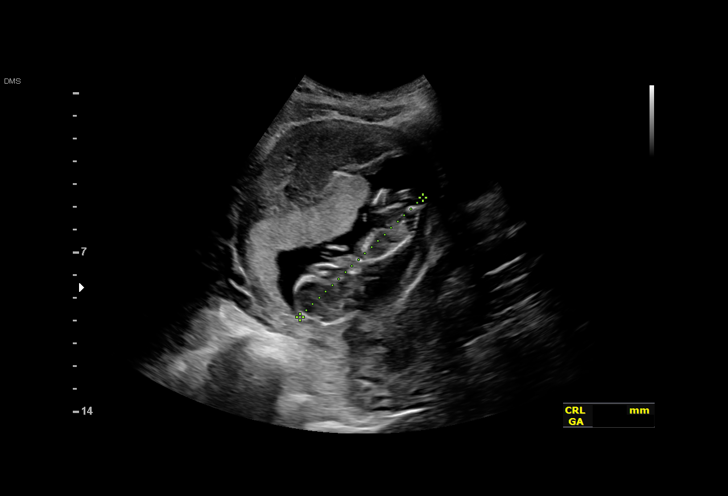
[im 7/15]
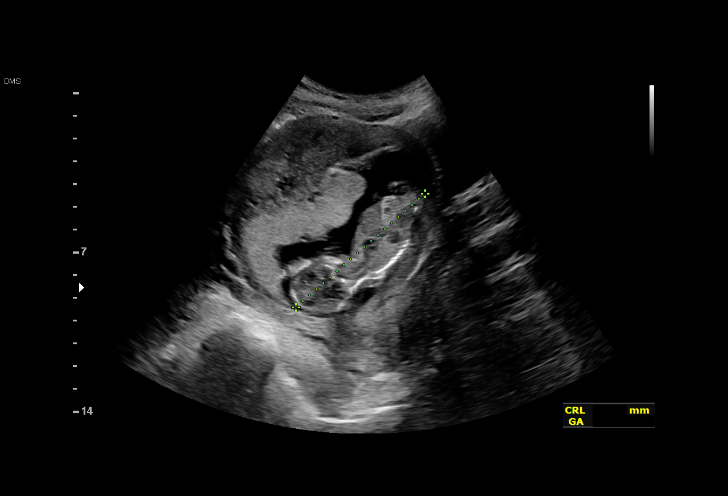
[im 8/15]
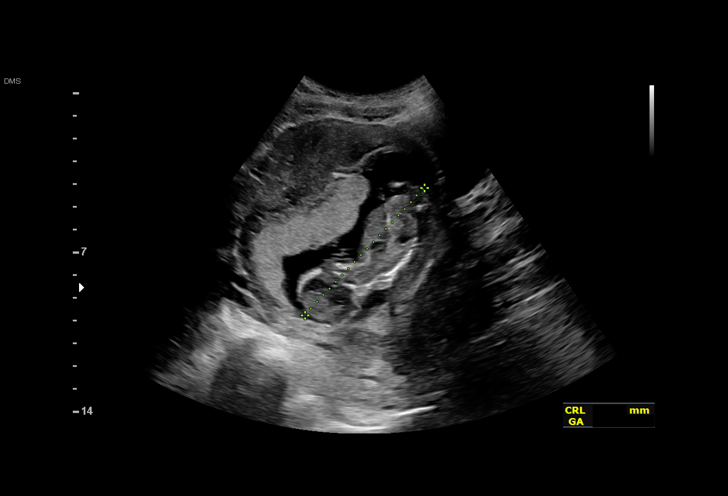
[im 9/15]
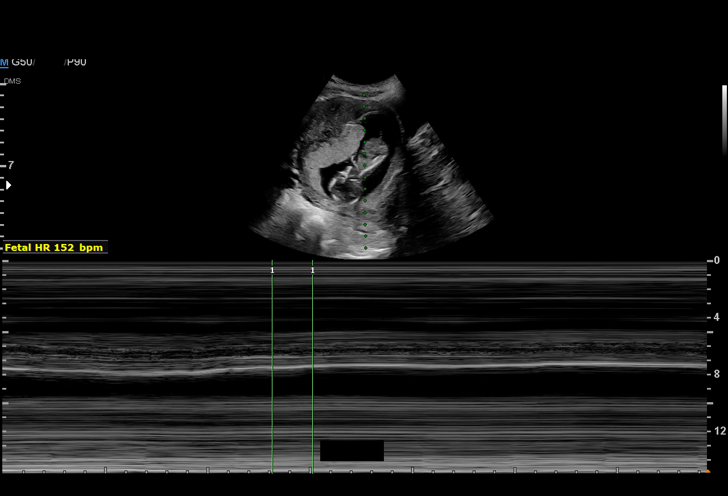
[im 10/15]
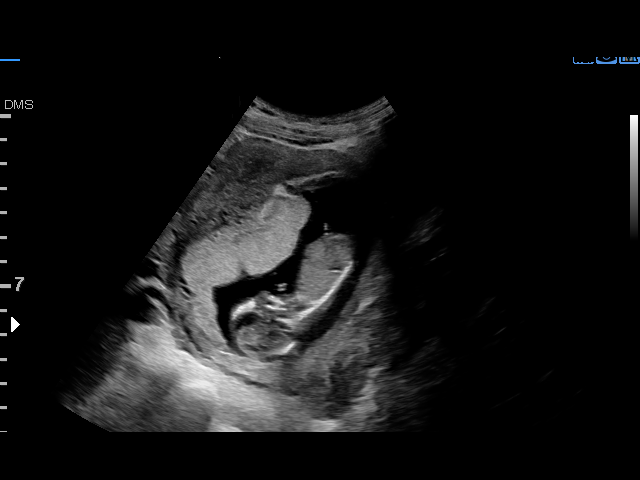
[im 11/15]
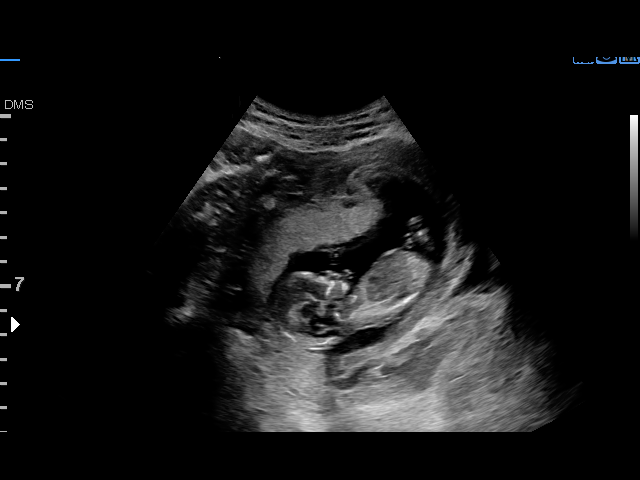
[im 12/15]
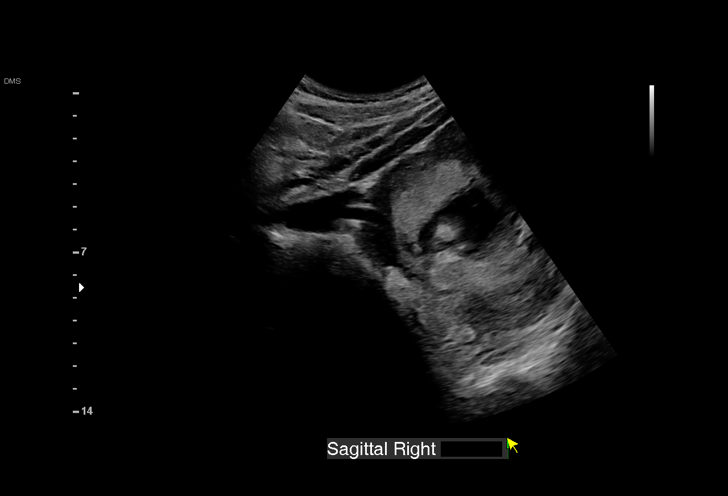
[im 13/15]
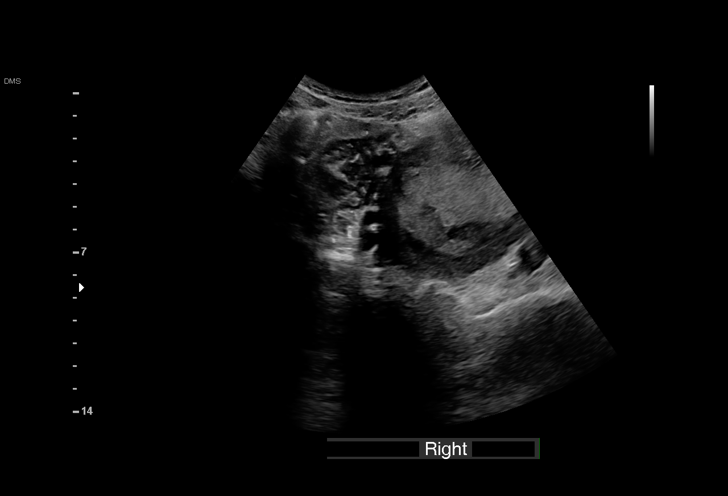
[im 14/15]
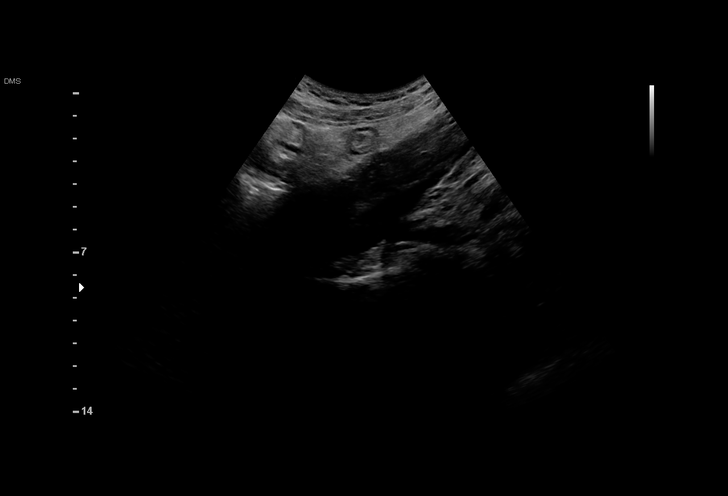
[im 15/15]
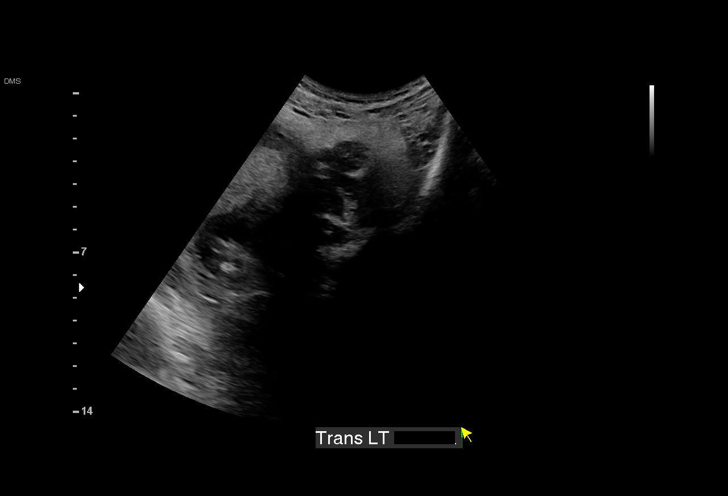

[15 of 15 positions shown; findings below may reference images not displayed]

FINDINGS: Intrauterine gestational sac: Single

Yolk sac:  Not Visualized.

Embryo:  Visualized.

Cardiac Activity: Visualized.

Heart Rate: 152 bpm

CRL:   75.8 mm   13 w 4 d                  US EDC: 07/28/2018

Subchorionic hemorrhage:  None

Maternal uterus/adnexae: Right and left ovaries are not visualized.
No free fluid in the pelvis.
IMPRESSION: Single live intrauterine gestation.  No subchorionic hemorrhage.

## 2019-01-14 IMAGING — US US MFM OB COMP +14 WKS
1 series · 14 of 28 positions shown · non-contrast
Comparison: none

[Series 1: us mfm ob comp +14 wks · 14 of 82 slices shown]
[im 4/82]
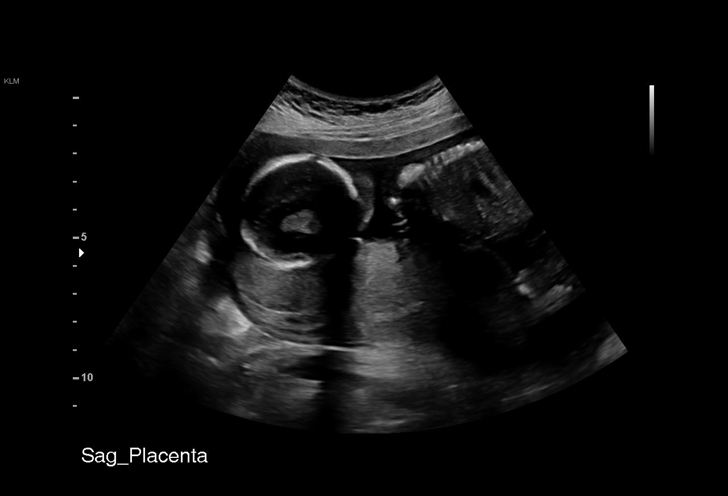
[im 10/82]
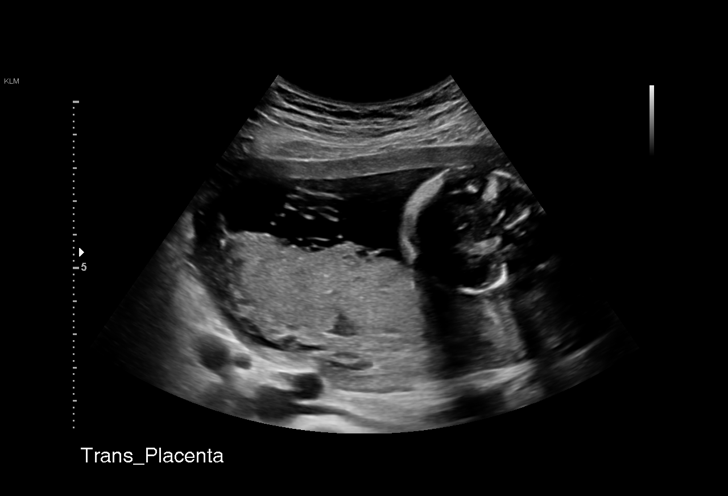
[im 16/82]
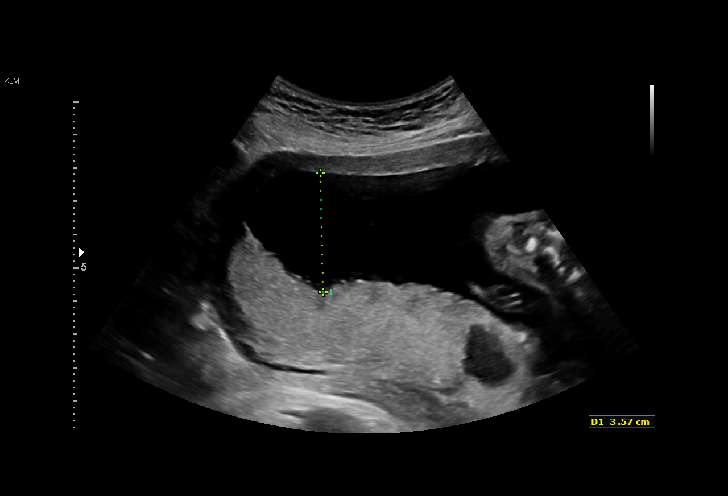
[im 22/82]
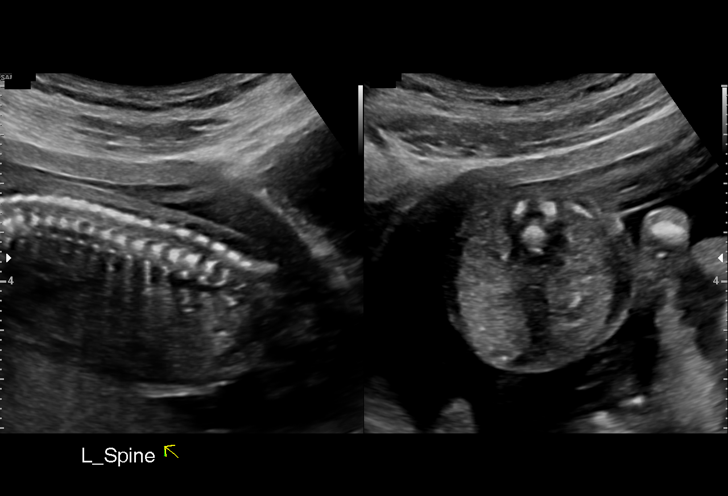
[im 28/82]
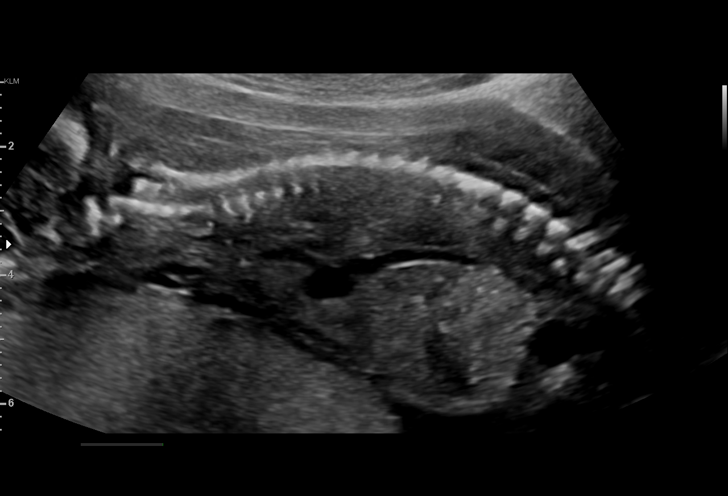
[im 34/82]
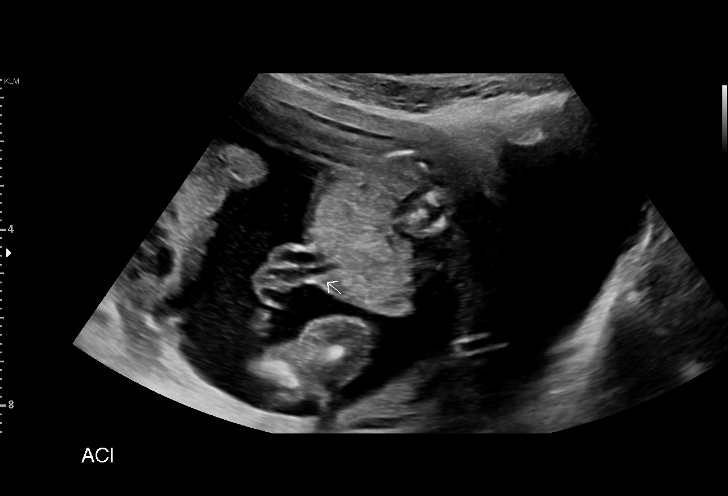
[im 40/82]
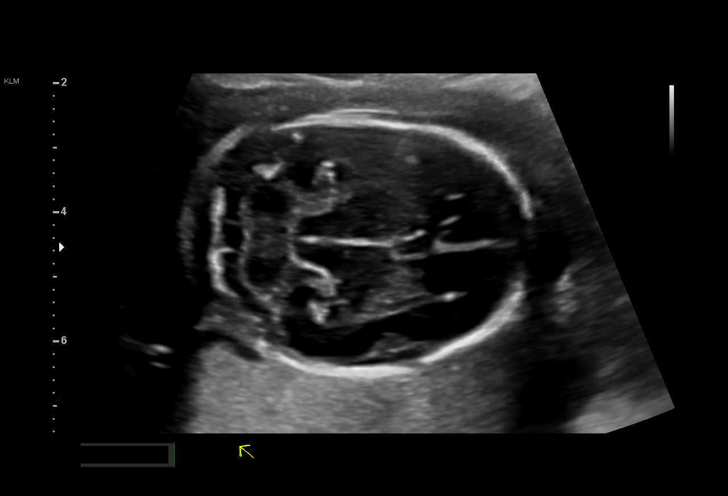
[im 46/82]
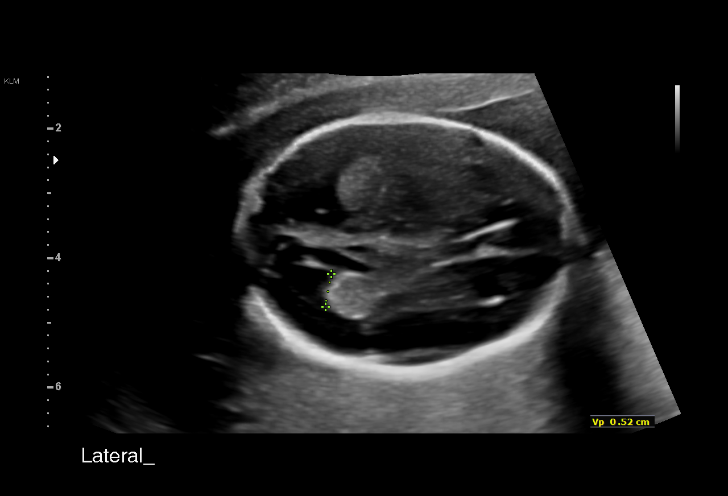
[im 52/82]
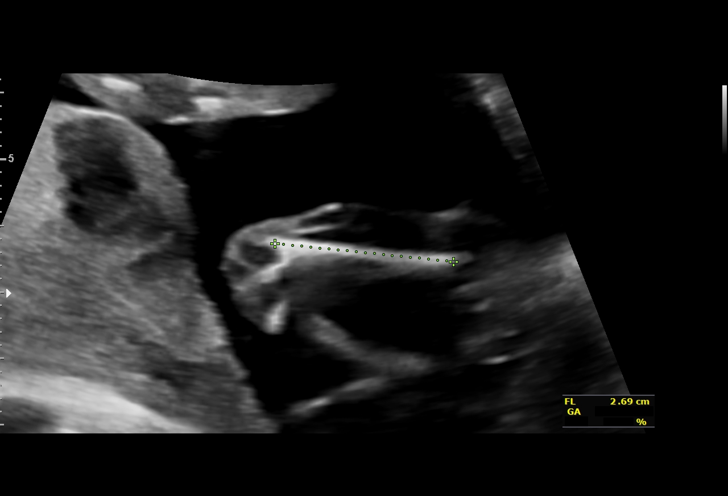
[im 58/82]
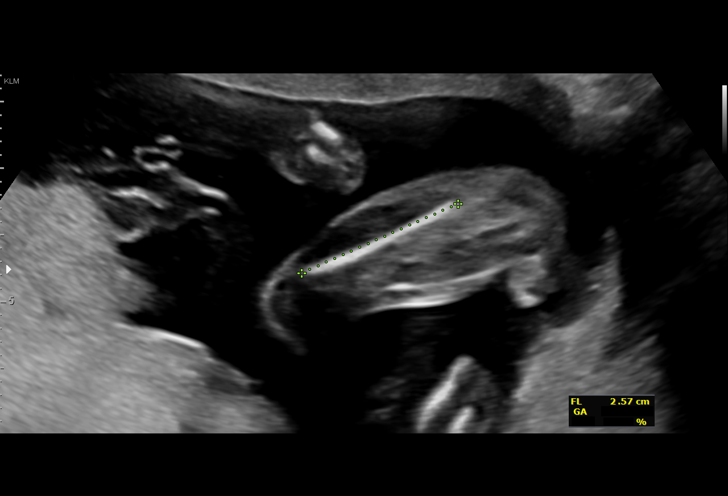
[im 64/82]
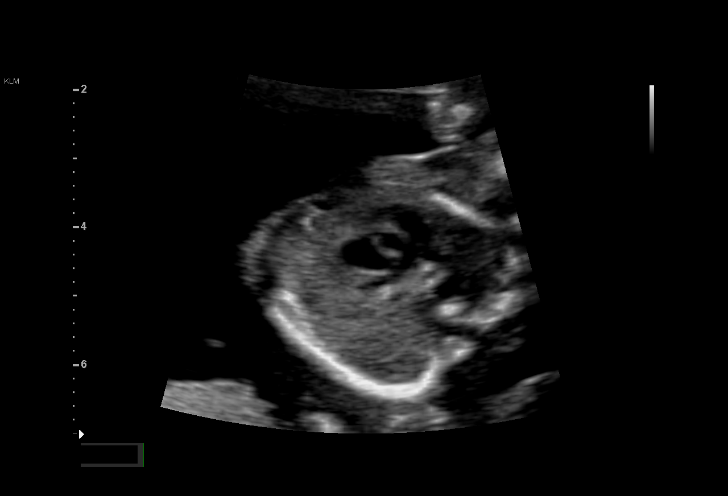
[im 70/82]
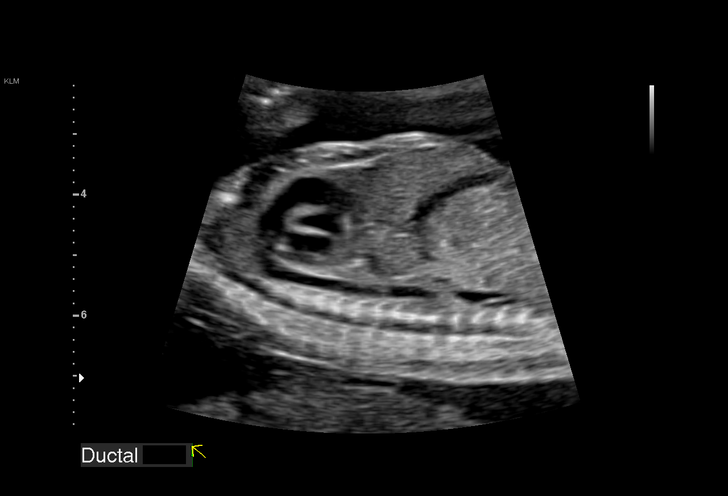
[im 76/82]
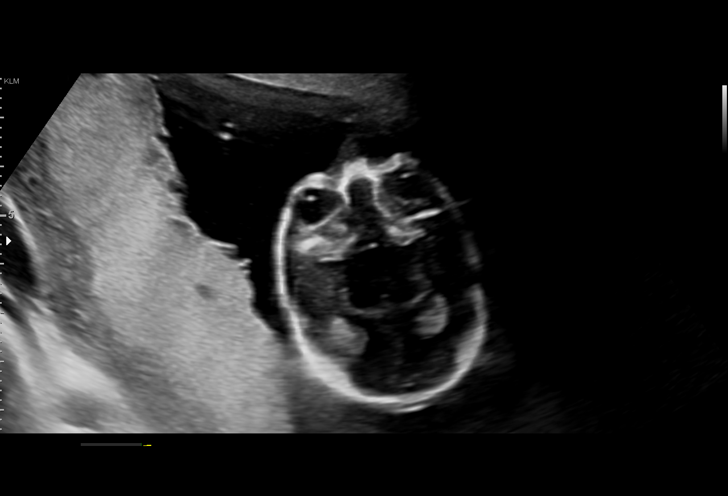
[im 82/82]
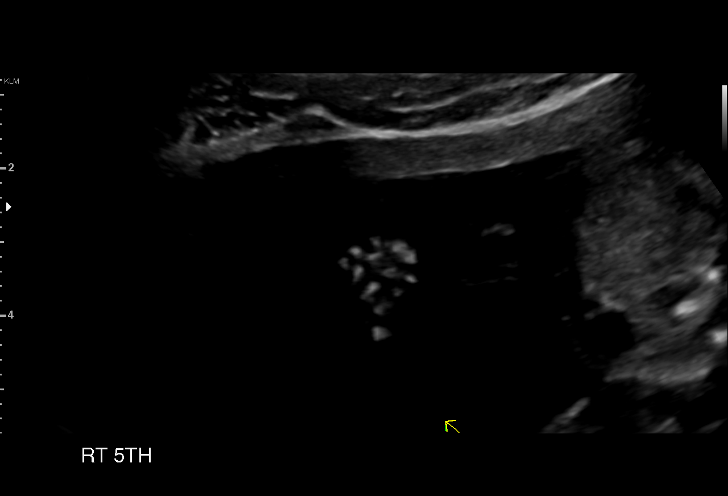

[14 of 28 positions shown; findings below may reference images not displayed]

CHUNG

Indications

Encounter for antenatal screening for
malformations
18 weeks gestation of pregnancy
Vital Signs

BMI:
Fetal Evaluation

Num Of Fetuses:         1
Fetal Heart Rate(bpm):  140
Cardiac Activity:       Observed
Presentation:           Breech
Placenta:               Posterior
P. Cord Insertion:      Visualized, central

Amniotic Fluid
AFI FV:      Within normal limits

Largest Pocket(cm)
3.57
OB History

Gravidity:    2          SAB:   1.
Gestational Age

Best:          18w 5d     Det. By:  Previous Ultrasound      EDD:   07/26/18
Anatomy
Cranium:               Appears normal         Aortic Arch:            Appears normal
Cavum:                 Appears normal         Ductal Arch:            Appears normal
Ventricles:            Appears normal         Diaphragm:              Appears normal
Choroid Plexus:        Appears normal         Stomach:                Appears normal, left
sided
Cerebellum:            Appears normal         Abdomen:                Appears normal
Posterior Fossa:       Appears normal         Abdominal Wall:         Appears nml (cord
insert, abd wall)
Nuchal Fold:           Appears normal         Cord Vessels:           Appears normal (3
vessel cord)
Face:                  Appears normal         Kidneys:                Appear normal
(orbits and profile)
Lips:                  Appears normal         Bladder:                Appears normal
Thoracic:              Appears normal         Spine:                  Ltd views no
intracranial signs of
NT
Heart:                 Appears normal         Upper Extremities:      Appears normal
(4CH, axis, and situs
RVOT:                  Appears normal         Lower Extremities:      Appears normal
LVOT:                  Appears normal

Other:  Parents do not wish to know sex of fetus. Open hands visualized.
Nasal bone visualized. 5th digit visualized.
Cervix Uterus Adnexa

Cervix
Length:           3.31  cm.
Normal appearance by transabdominal scan.

Uterus
No abnormality visualized.

Left Ovary
Not visualized.

Right Ovary
Within normal limits.

Cul De Sac
No free fluid seen.

Adnexa
No abnormality visualized.
Comments

U/S images reviewed.   Appropriate fetal growth is noted.  No
fetal abnormalities are seen.
Recommendations: 1) Follow-up as clinically indicated
Recommendations

Follow-up as clinically indicated

## 2020-11-03 IMAGING — US US MFM OB FOLLOW-UP
1 series · 14 of 28 positions shown · non-contrast
Comparison: none

[Series 1: us mfm ob follow-up · 34 acquisitions, 14 frames shown]
[im 2/34]
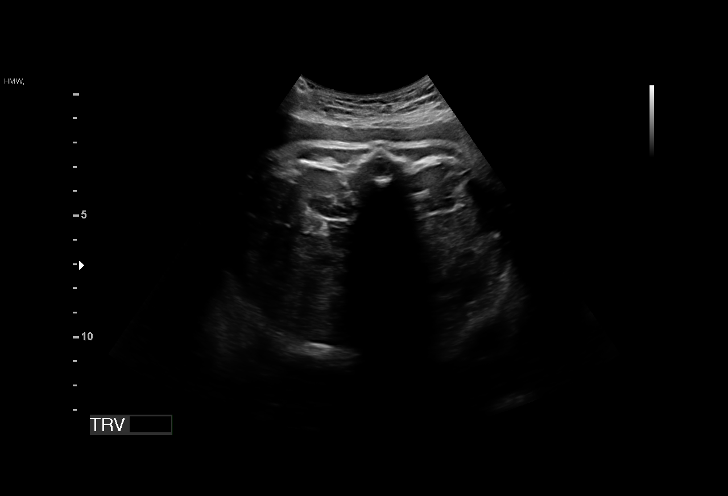
[im 4/34]
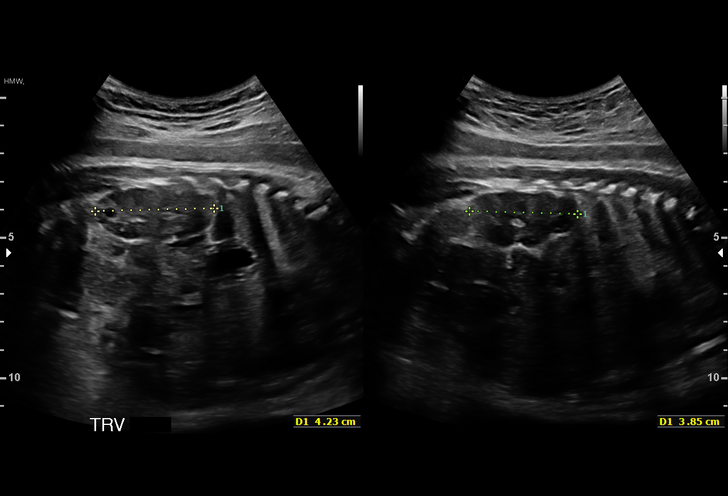
[im 7/34]
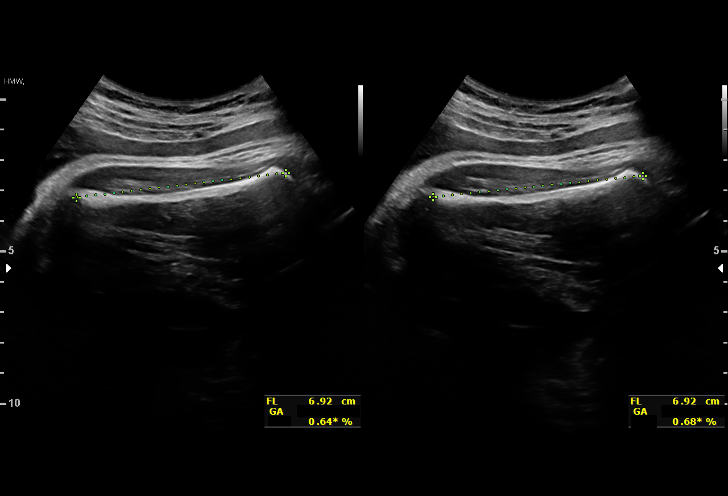
[im 9/34]
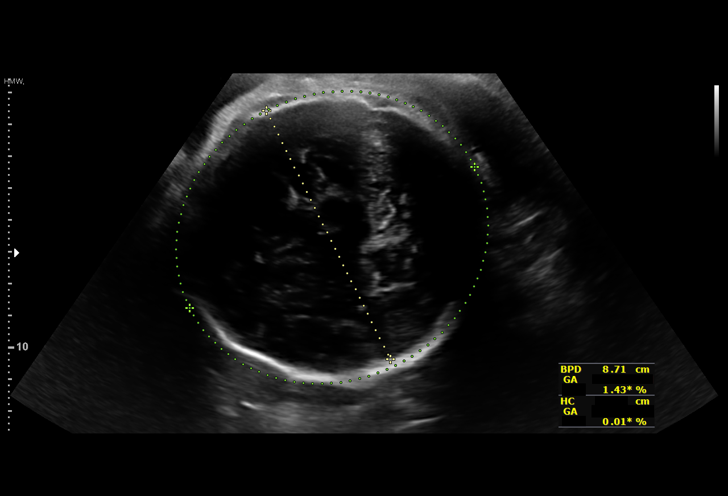
[im 12/34]
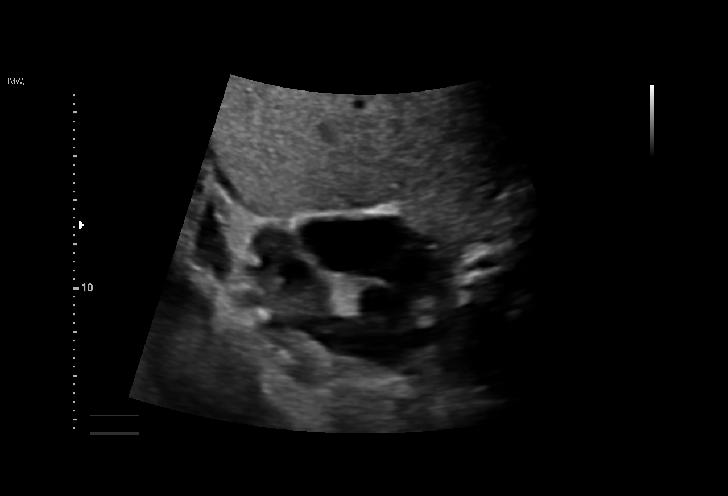
[im 14/34]
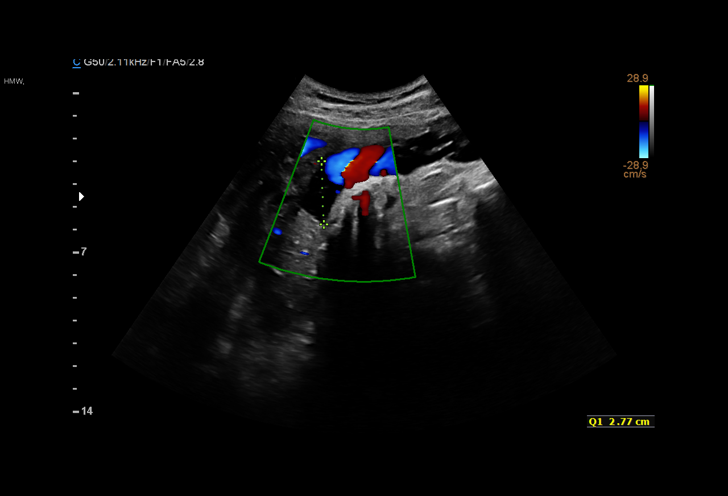
[im 16/34]
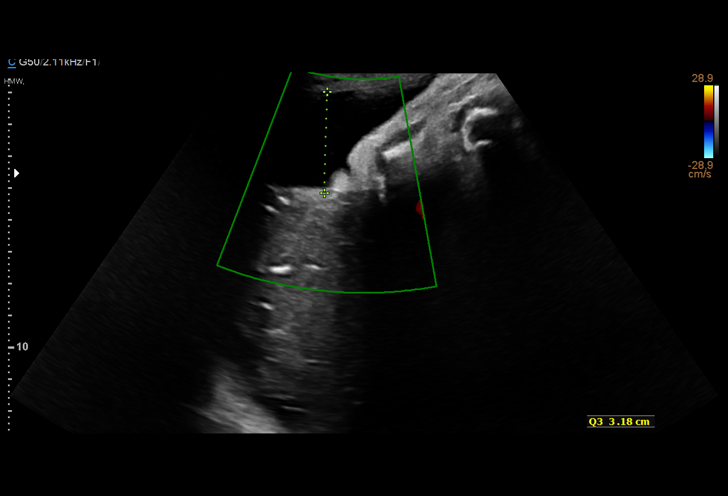
[im 19/34]
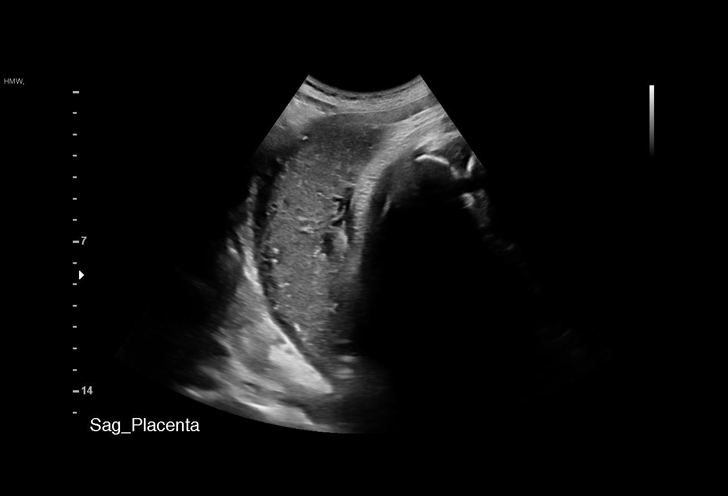
[im 21/34]
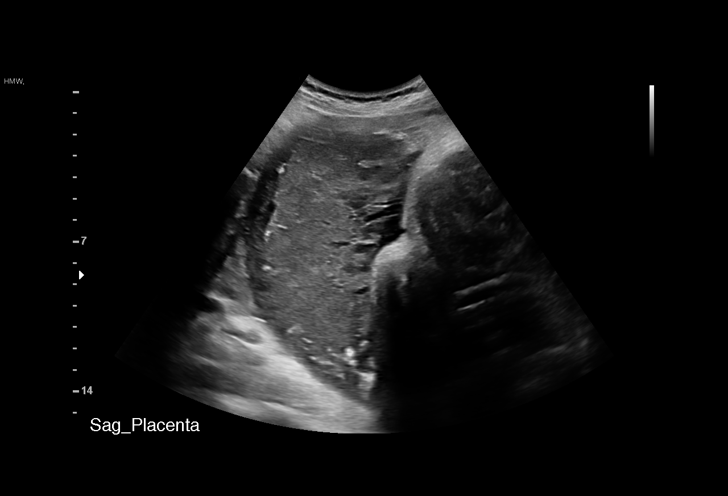
[im 24/34]
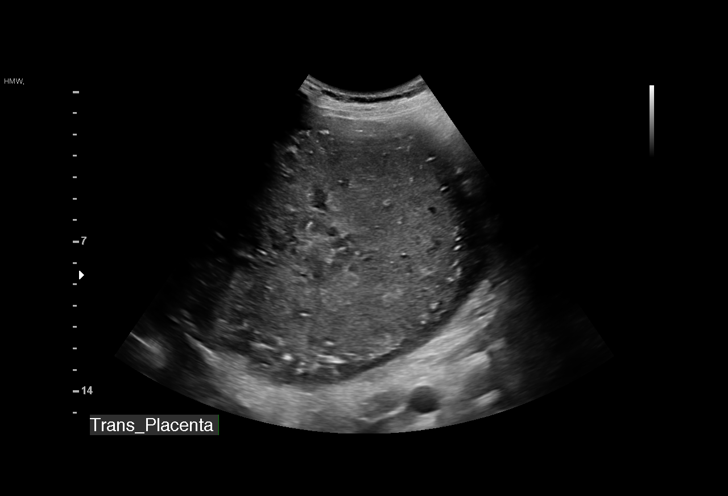
[im 26/34]
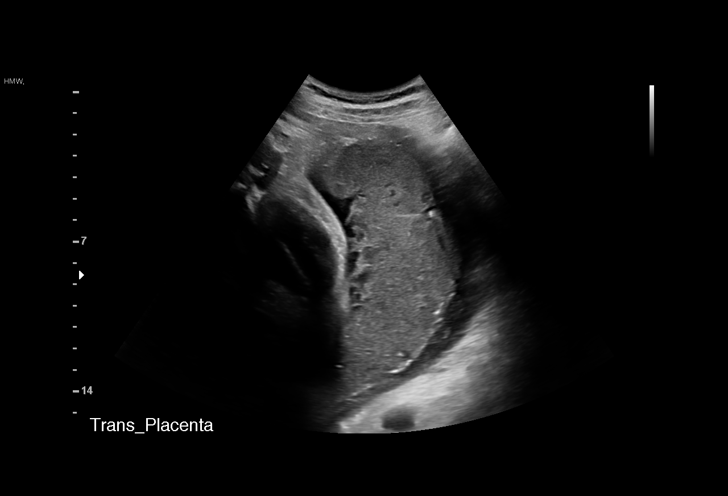
[im 29/34]
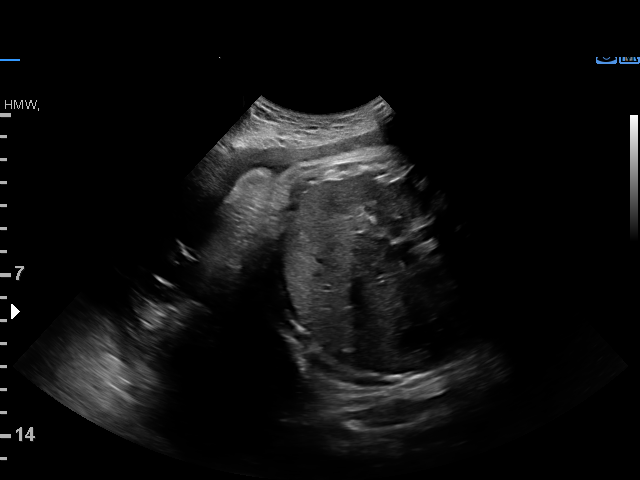
[im 31/34]
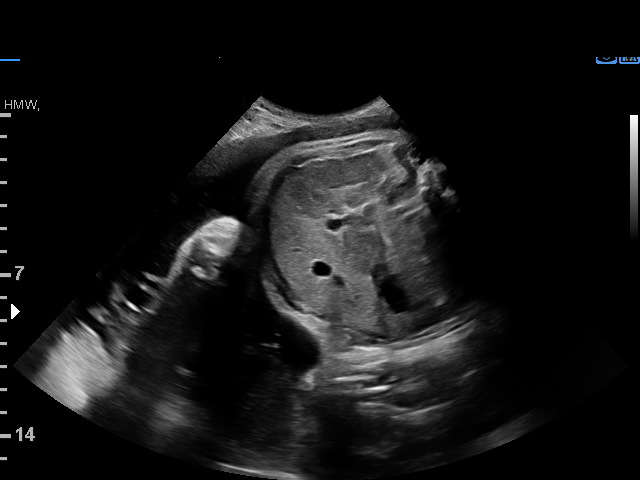
[im 34/34]
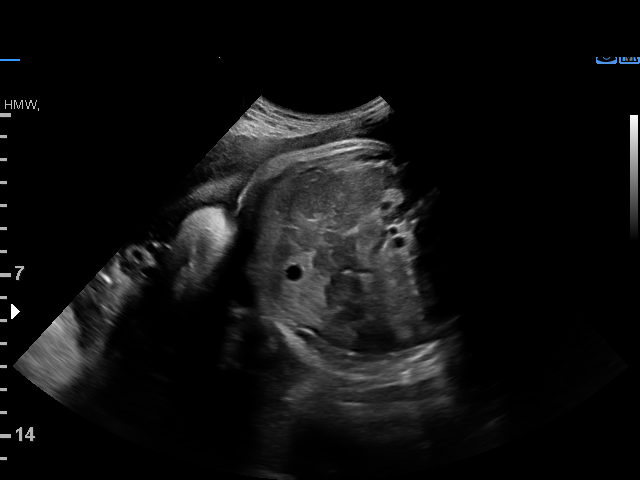

[14 of 28 positions shown; findings below may reference images not displayed]

----------------------------------------------------------------------

 ----------------------------------------------------------------------
Indications

  39 weeks gestation of pregnancy
  Uterine size-date discrepancy, third trimester
 ----------------------------------------------------------------------
Vital Signs

                                                 Height:        5'5"
Fetal Evaluation

 Num Of Fetuses:          1
 Fetal Heart Rate(bpm):   134
 Cardiac Activity:        Observed
 Presentation:            Cephalic
 Placenta:                Posterior
 P. Cord Insertion:       Visualized, central

 Amniotic Fluid
 AFI FV:      Within normal limits

 AFI Sum(cm)     %Tile       Largest Pocket(cm)
 10.69           35

 RUQ(cm)       RLQ(cm)        LUQ(cm)        LLQ(cm)

Biometry

 BPD:      87.9   mm     G. Age:  35w 4d          3  %    CI:         84.05  %    70 - 86
                                                          FL/HC:       22.9  %    20.7 -
 HC:      302.2   mm     G. Age:  33w 4d        < 3  %    HC/AC:       0.89       0.87 -
 AC:      341.4   mm     G. Age:  38w 0d         32  %    FL/BPD:      78.7  %    71 - 87
 FL:       69.2   mm     G. Age:  35w 4d        < 3  %    FL/AC:       20.3  %    20 - 24
 HUM:      58.2   mm     G. Age:  33w 5d        < 5  %
 Est. FW:    8445   gm    6 lb 10 oz      27  %
OB History

 Gravidity:     2         SAB:   1.
Gestational Age

 U/S Today:      35w 5d                                         EDD:  08/22/18
 Best:           39w 4d    Det. By:  Previous Ultrasound        EDD:  07/26/18
Anatomy

 Cranium:                Appears normal         Aortic Arch:            Previously seen
 Cavum:                  Previously seen        Ductal Arch:            Previously seen
 Ventricles:             Appears normal         Diaphragm:              Previously seen
 Choroid Plexus:         Previously seen        Stomach:                Appears normal, left
                                                                        sided
 Cerebellum:             Previously seen        Abdomen:                Previously seen
 Posterior Fossa:        Previously seen        Abdominal Wall:         Previously seen
 Nuchal Fold:            Previously seen        Cord Vessels:           Previously seen
 Face:                   Orbits and profile     Kidneys:                Appear normal
                         previously seen
 Lips:                   Previously seen        Bladder:                Appears normal
 Thoracic:               Appears normal         Spine:                  Previously seen
 Heart:                  Previously seen        Upper Extremities:      Previously seen
 RVOT:                   Appears normal         Lower Extremities:      Previously seen
 LVOT:                   Previously seen

 Other:   Parents do not wish to know sex of fetus. Open hands visualized
          previously. Nasal bone visualized previously. 5th digit visualized
          previously.
Cervix Uterus Adnexa

 Cervix
 Not visualized (advanced GA >90wks)
Impression

 Normal interval growth.
 Suboptimal measurements of certain biometry obtained
 secondary to fetal gestational age.
Recommendations

 Follow up as clinically indicated

## 2021-05-06 ENCOUNTER — Other Ambulatory Visit: Payer: Self-pay

## 2021-05-06 ENCOUNTER — Emergency Department (HOSPITAL_BASED_OUTPATIENT_CLINIC_OR_DEPARTMENT_OTHER)
Admission: EM | Admit: 2021-05-06 | Discharge: 2021-05-06 | Disposition: A | Discharge status: Home / Self Care | Payer: Medicaid Other | Attending: Emergency Medicine | Admitting: Emergency Medicine

## 2021-05-06 ENCOUNTER — Encounter (HOSPITAL_BASED_OUTPATIENT_CLINIC_OR_DEPARTMENT_OTHER): Payer: Self-pay | Admitting: Emergency Medicine

## 2021-05-06 DIAGNOSIS — N898 Other specified noninflammatory disorders of vagina: Secondary | ICD-10-CM

## 2021-05-06 DIAGNOSIS — F17210 Nicotine dependence, cigarettes, uncomplicated: Secondary | ICD-10-CM | POA: Insufficient documentation

## 2021-05-06 DIAGNOSIS — A599 Trichomoniasis, unspecified: Secondary | ICD-10-CM | POA: Diagnosis not present

## 2021-05-06 DIAGNOSIS — A5901 Trichomonal vulvovaginitis: Secondary | ICD-10-CM

## 2021-05-06 DIAGNOSIS — J45909 Unspecified asthma, uncomplicated: Secondary | ICD-10-CM | POA: Insufficient documentation

## 2021-05-06 LAB — URINALYSIS, ROUTINE W REFLEX MICROSCOPIC
Bilirubin Urine: NEGATIVE
Glucose, UA: NEGATIVE mg/dL
Hgb urine dipstick: NEGATIVE
Ketones, ur: NEGATIVE mg/dL
Leukocytes,Ua: NEGATIVE
Nitrite: NEGATIVE
Protein, ur: NEGATIVE mg/dL
Specific Gravity, Urine: 1.025 (ref 1.005–1.030)
pH: 6.5 (ref 5.0–8.0)

## 2021-05-06 LAB — WET PREP, GENITAL
Sperm: NONE SEEN
Trich, Wet Prep: NONE SEEN
WBC, Wet Prep HPF POC: <10 (ref <10)
Yeast Wet Prep HPF POC: NONE SEEN

## 2021-05-06 LAB — PREGNANCY, URINE: Preg Test, Ur: NEGATIVE

## 2021-05-06 MED ORDER — METRONIDAZOLE 500 MG PO TABS: 500.0000 mg | ORAL_TABLET | Freq: Two times a day (BID) | ORAL | 0 refills | Status: DC

## 2021-05-06 MED ORDER — DOXYCYCLINE HYCLATE 100 MG PO CAPS: 100.0000 mg | ORAL_CAPSULE | Freq: Two times a day (BID) | ORAL | 0 refills | Status: AC

## 2021-05-06 MED ORDER — METRONIDAZOLE 500 MG PO TABS: 500.0000 mg | ORAL_TABLET | Freq: Two times a day (BID) | ORAL | 0 refills | Status: AC

## 2021-05-06 MED ORDER — CEFTRIAXONE SODIUM 500 MG IJ SOLR
500.0000 mg | Freq: Once | INTRAMUSCULAR | Status: AC
  Administered 2021-05-06: 500 mg via INTRAMUSCULAR
  Filled 2021-05-06: qty 500

## 2021-05-06 MED ORDER — ACETAMINOPHEN 80 MG RE SUPP
RECTAL | Status: AC
  Filled 2021-05-06: qty 1

## 2021-05-06 MED ORDER — DOXYCYCLINE HYCLATE 100 MG PO CAPS: 100.0000 mg | ORAL_CAPSULE | Freq: Two times a day (BID) | ORAL | 0 refills | Status: DC

## 2021-05-06 NOTE — ED Triage Notes (Signed)
Patient arrived via POV c/o vaginal discharge with odor x 2 weeks. Patient states itching but no burning or pain. Patient is ao x 4, VS WDL, normal gait.

## 2021-05-06 NOTE — ED Provider Notes (Addendum)
[facility] EMERGENCY DEPARTMENT Provider Note   CSN: [id number] Arrival date & time: 05/06/21  4383     History Chief Complaint  Patient presents with   Vaginal Discharge    Samantha Casey is a 30 y.o. female.  HPI 30 year old female G2, P1 LMP 2 weeks ago presents today complaining of vaginal discharge.  She states vaginal discharge began before her last menstrual cycle.  She describes it is discoloration with a bad odor.  She reports that she has had an STI in the past.  She has been sexually active with multiple partners over the past year.  She is states that it is possible that she is pregnant but she does not think that she is.  Last menstrual cycle was normal and normal time.  She denies any abdominal pain, nausea, vomiting, vaginal pain, pelvic pain, or fever, UTI symptoms.     Past Medical History:  Diagnosis Date   Allergy    Anemia    Anxiety    Asthma    Depression    doing ok now   Gastroparesis    Headache    Scoliosis    Seizures ([place])    "slighltly epileptic", last seizure was "years ago"   Substance abuse [facility])     Patient Active Problem List   Diagnosis Date Noted   Thin meconium stained amniotic fluid 07/25/2018   Normal labor 07/25/2018   History of tooth extraction 04/25/2018   Alcohol use affecting pregnancy in second trimester 02/28/2018   Supervision of other normal pregnancy, antepartum 01/31/2018   Hx of migraines 01/31/2018   Trichomonas infection 01/24/2018   Drug use affecting pregnancy 12/19/2017   Asthma 12/06/2016   Depression with anxiety 12/06/2016    Past Surgical History:  Procedure Laterality Date   NO PAST SURGERIES     UPPER GI ENDOSCOPY       OB History     Gravida  2   Para  1   Term  1   Preterm  0   AB  1   Living  1      SAB  1   IAB  0   Ectopic  0   Multiple  0   Live Births  1           Family History  Problem Relation Age of Onset   Hyperlipidemia Mother     Hypertension Mother    Hyperlipidemia Father    Stroke Father    Diabetes Father    Hypertension Father    Heart disease Father    Mental illness Sister    Cancer Paternal Grandfather    Hyperlipidemia Paternal Grandfather     Social History   Tobacco Use   Smoking status: Every Day    Packs/day: 0.50    Types: Cigarettes    Last attempt to quit: 12/18/2017    Years since quitting: 3.3   Smokeless tobacco: Never   Tobacco comments:    2 cigs a day  Vaping Use   Vaping Use: Former  Substance Use Topics   Alcohol use: Yes    Comment: last was end of Feb   Drug use: Yes    Types: Marijuana    Comment: end of Feb    Home Medications Prior to Admission medications   Medication Sig Start Date End Date Taking? Authorizing Provider  doxycycline (VIBRAMYCIN) 100 MG capsule Take 1 capsule (100 mg total) by mouth 2 (two) times daily. 05/06/21  Yes  Pattricia Boss, MD  metroNIDAZOLE (FLAGYL) 500 MG tablet Take 1 tablet (500 mg total) by mouth 2 (two) times daily. 05/06/21  Yes Pattricia Boss, MD  albuterol (PROVENTIL HFA;VENTOLIN HFA) 108 (90 Base) MCG/ACT inhaler Inhale 2 puffs into the lungs daily.    [provider]  ibuprofen (ADVIL,MOTRIN) 600 MG tablet Take 1 tablet (600 mg total) by mouth every 6 (six) hours. 07/27/18   Justus Memory, MD  Prenatal Vit-Fe Fumarate-FA (PRENATAL MULTIVITAMIN) TABS tablet Take 1 tablet by mouth daily at 12 noon.    [provider]  senna-docusate (SENOKOT-S) 8.6-50 MG tablet Take 2 tablets by mouth daily. 07/28/18   Justus Memory, MD    Allergies    Hydrocodone, Lactalbumin, and Milk-related compounds  Review of Systems   Review of Systems  All other systems reviewed and are negative.  Physical Exam Updated Vital Signs BP (!) 143/95 (BP Location: Right Arm)    Pulse 73    Temp 98.2 F (36.8 C) (Oral)    Resp 18    Ht 1.651 m ('5\' 5"' )    Wt 74.4 kg    LMP 04/29/2021 (Exact Date)    SpO2 100%    BMI 27.29 kg/m   Physical  Exam Vitals and nursing note reviewed. Exam conducted with a chaperone present.  Constitutional:      General: She is not in acute distress.    Appearance: She is well-developed.  HENT:     Head: Normocephalic and atraumatic.     Right Ear: External ear normal.     Left Ear: External ear normal.     Nose: Nose normal.  Eyes:     Conjunctiva/sclera: Conjunctivae normal.     Pupils: Pupils are equal, round, and reactive to light.  Cardiovascular:     Rate and Rhythm: Normal rate and regular rhythm.  Pulmonary:     Effort: Pulmonary effort is normal.  Abdominal:     General: Abdomen is flat. Bowel sounds are normal.     Palpations: Abdomen is soft.     Tenderness: There is no abdominal tenderness.     Hernia: There is no hernia in the left inguinal area or right inguinal area.  Genitourinary:    General: Normal vulva.     Exam position: Lithotomy position.     Pubic Area: No rash.      Labia:        Right: No rash, tenderness or lesion.        Left: No rash, tenderness or lesion.      Urethra: No urethral pain.     Vagina: Vaginal discharge present.     Cervix: Discharge present. No cervical motion tenderness, friability, lesion, erythema, cervical bleeding or eversion.     Uterus: Not deviated, not enlarged, not tender and no uterine prolapse.      Adnexa: Right adnexa normal and left adnexa normal.  Musculoskeletal:        General: Normal range of motion.     Cervical back: Normal range of motion and neck supple.  Skin:    General: Skin is warm and dry.  Neurological:     Mental Status: She is alert and oriented to person, place, and time.     Motor: No abnormal muscle tone.     Coordination: Coordination normal.  Psychiatric:        Behavior: Behavior normal.        Thought Content: Thought content normal.    ED Results /  Procedures / Treatments   Labs (all labs ordered are listed, but only abnormal results are displayed) Labs Reviewed  WET PREP, GENITAL -  Abnormal; Notable for the following components:      Result Value   Clue Cells Wet Prep HPF POC PRESENT (*)    All other components within normal limits  URINALYSIS, ROUTINE W REFLEX MICROSCOPIC - Abnormal; Notable for the following components:   APPearance HAZY (*)    All other components within normal limits  PREGNANCY, URINE  GC/CHLAMYDIA PROBE AMP ([place]) NOT AT [facility]    EKG None  Radiology No results found.  Procedures Procedures   Medications Ordered in ED Medications  cefTRIAXone (ROCEPHIN) injection 500 mg (500 mg Intramuscular Given 05/06/21 0736)    ED Course  I have reviewed the triage vital signs and the nursing notes.  Pertinent labs & imaging results that were available during my care of the patient were reviewed by me and considered in my medical decision making (see chart for details).    MDM Rules/Calculators/A&P                         30 year old female presents today complaining of vaginal discharge.  GC chlamydia swabs obtained and patient is treated empirically given age and sexual activity.  She is given Rocephin 500 mg in the ED.  Doxycycline 100 mg twice daily for 7 days has been ordered outpatient. Awaiting wet prep. Discussed with patient the empiric treatment and the need to continue outpatient treatment she will follow-up in MyChart regarding blood tests. We also discussed that she had high blood pressure of 143/95.  She said that she just went to cigarette.  Patient advised that she says this rechecked as an outpatient. Will prescribe met for trichomonas  Final Clinical Impression(s) / ED Diagnoses Final diagnoses:  Vaginal discharge  Trichomonas vaginalis (TV) infection    Rx / DC Orders ED Discharge Orders          Ordered    doxycycline (VIBRAMYCIN) 100 MG capsule  2 times daily        05/06/21 0720    metroNIDAZOLE (FLAGYL) 500 MG tablet  2 times daily        05/06/21 0737             Pattricia Boss, MD 05/06/21  6712    Pattricia Boss, MD 05/06/21 [phone]

## 2021-05-06 NOTE — ED Notes (Addendum)
Pt back from b/r, steady gait, pelvic completed by Dr Rosalia Hammers with RN chaperone present. Tolerated well.  Specimens sent. PT alert, NAD, calm, interactive.

## 2021-05-06 NOTE — Discharge Instructions (Addendum)
You were treated here for sexually transmitted infections of gonorrhea and chlamydia.  These test do not come back today you were treated on the basis of your age and symptoms.  Please follow-up in MyChart and you will need to notify any contacts of these are positive Additionally, your blood pressure was elevated here at 143/95 this could be because of situational issues and should not be taken as definitive diagnosis of high blood pressure.  However, this should be rechecked in outpatient setting within the next 1 to 2 weeks.

## 2021-05-09 LAB — GC/CHLAMYDIA PROBE AMP (~~LOC~~) NOT AT ARMC
Chlamydia: NEGATIVE
Comment: NEGATIVE
Comment: NORMAL
Neisseria Gonorrhea: NEGATIVE
